# Patient Record
Sex: Female | Born: 1970 | Race: White | Hispanic: No | Marital: Married | Smoking: Current every day smoker
Health system: Southern US, Community
[De-identification: ages and names within clinical notes are randomized; demographics above are authoritative.]

## PROBLEM LIST (undated history)

## (undated) DIAGNOSIS — E785 Hyperlipidemia, unspecified: Secondary | ICD-10-CM

## (undated) DIAGNOSIS — F419 Anxiety disorder, unspecified: Secondary | ICD-10-CM

## (undated) DIAGNOSIS — D126 Benign neoplasm of colon, unspecified: Secondary | ICD-10-CM

## (undated) DIAGNOSIS — F329 Major depressive disorder, single episode, unspecified: Secondary | ICD-10-CM

## (undated) DIAGNOSIS — K760 Fatty (change of) liver, not elsewhere classified: Secondary | ICD-10-CM

## (undated) DIAGNOSIS — I10 Essential (primary) hypertension: Secondary | ICD-10-CM

## (undated) DIAGNOSIS — F32A Depression, unspecified: Secondary | ICD-10-CM

## (undated) DIAGNOSIS — E538 Deficiency of other specified B group vitamins: Secondary | ICD-10-CM

## (undated) HISTORY — DX: Hyperlipidemia, unspecified: E78.5

## (undated) HISTORY — DX: Benign neoplasm of colon, unspecified: D12.6

## (undated) HISTORY — DX: Fatty (change of) liver, not elsewhere classified: K76.0

## (undated) HISTORY — PX: TONSILLECTOMY: SUR1361

## (undated) HISTORY — DX: Depression, unspecified: F32.A

## (undated) HISTORY — DX: Anxiety disorder, unspecified: F41.9

## (undated) HISTORY — DX: Major depressive disorder, single episode, unspecified: F32.9

---

## 2000-10-29 ENCOUNTER — Ambulatory Visit (HOSPITAL_COMMUNITY): Admission: RE | Admit: 2000-10-29 | Discharge: 2000-10-29 | Payer: Self-pay | Admitting: Family Medicine

## 2000-10-29 ENCOUNTER — Encounter: Payer: Self-pay | Admitting: Family Medicine

## 2000-11-05 ENCOUNTER — Ambulatory Visit (HOSPITAL_COMMUNITY): Admission: RE | Admit: 2000-11-05 | Discharge: 2000-11-05 | Payer: Self-pay | Admitting: Family Medicine

## 2000-11-05 ENCOUNTER — Encounter: Payer: Self-pay | Admitting: Family Medicine

## 2004-08-19 ENCOUNTER — Emergency Department (HOSPITAL_COMMUNITY): Admission: EM | Admit: 2004-08-19 | Discharge: 2004-08-19 | Payer: Self-pay | Admitting: Emergency Medicine

## 2004-09-08 ENCOUNTER — Ambulatory Visit (HOSPITAL_COMMUNITY): Admission: RE | Admit: 2004-09-08 | Discharge: 2004-09-08 | Payer: Self-pay | Admitting: Family Medicine

## 2017-07-03 DIAGNOSIS — D126 Benign neoplasm of colon, unspecified: Secondary | ICD-10-CM

## 2017-07-03 HISTORY — DX: Benign neoplasm of colon, unspecified: D12.6

## 2017-07-27 ENCOUNTER — Encounter: Payer: Self-pay | Admitting: Family Medicine

## 2017-07-27 ENCOUNTER — Ambulatory Visit (INDEPENDENT_AMBULATORY_CARE_PROVIDER_SITE_OTHER): Payer: Managed Care, Other (non HMO) | Admitting: Family Medicine

## 2017-07-27 VITALS — BP 159/106 | HR 100 | Temp 97.3°F | Ht 64.0 in | Wt 197.0 lb

## 2017-07-27 DIAGNOSIS — E7849 Other hyperlipidemia: Secondary | ICD-10-CM | POA: Diagnosis not present

## 2017-07-27 DIAGNOSIS — F1010 Alcohol abuse, uncomplicated: Secondary | ICD-10-CM | POA: Diagnosis not present

## 2017-07-27 DIAGNOSIS — Z124 Encounter for screening for malignant neoplasm of cervix: Secondary | ICD-10-CM

## 2017-07-27 DIAGNOSIS — I10 Essential (primary) hypertension: Secondary | ICD-10-CM | POA: Diagnosis not present

## 2017-07-27 DIAGNOSIS — F17200 Nicotine dependence, unspecified, uncomplicated: Secondary | ICD-10-CM | POA: Insufficient documentation

## 2017-07-27 DIAGNOSIS — Z130 Encounter for screening for diseases of the blood and blood-forming organs and certain disorders involving the immune mechanism: Secondary | ICD-10-CM

## 2017-07-27 DIAGNOSIS — Z01419 Encounter for gynecological examination (general) (routine) without abnormal findings: Secondary | ICD-10-CM

## 2017-07-27 DIAGNOSIS — I152 Hypertension secondary to endocrine disorders: Secondary | ICD-10-CM | POA: Insufficient documentation

## 2017-07-27 DIAGNOSIS — K760 Fatty (change of) liver, not elsewhere classified: Secondary | ICD-10-CM | POA: Insufficient documentation

## 2017-07-27 DIAGNOSIS — E1169 Type 2 diabetes mellitus with other specified complication: Secondary | ICD-10-CM | POA: Insufficient documentation

## 2017-07-27 DIAGNOSIS — Z1329 Encounter for screening for other suspected endocrine disorder: Secondary | ICD-10-CM

## 2017-07-27 DIAGNOSIS — E663 Overweight: Secondary | ICD-10-CM | POA: Diagnosis not present

## 2017-07-27 DIAGNOSIS — Z7689 Persons encountering health services in other specified circumstances: Secondary | ICD-10-CM

## 2017-07-27 DIAGNOSIS — Z800 Family history of malignant neoplasm of digestive organs: Secondary | ICD-10-CM

## 2017-07-27 DIAGNOSIS — Z114 Encounter for screening for human immunodeficiency virus [HIV]: Secondary | ICD-10-CM

## 2017-07-27 LAB — BAYER DCA HB A1C WAIVED: HB A1C (BAYER DCA - WAIVED): 6.1 % (ref <7.0)

## 2017-07-27 MED ORDER — HYDROCHLOROTHIAZIDE 25 MG PO TABS: 25.0000 mg | ORAL_TABLET | Freq: Every day | ORAL | 0 refills | Status: DC

## 2017-07-27 NOTE — Assessment & Plan Note (Signed)
Counseling provided.  Patient does wish to discontinue smoking if possible.  She is failed nicotine patches and gum.  Amenable to trying Chantix.  Wellbutrin may not be a good option given her history of anxiety.  Will await lab results before initiating medications.

## 2017-07-27 NOTE — Assessment & Plan Note (Signed)
Patient reported.  Will check lipid panel.

## 2017-07-27 NOTE — Patient Instructions (Addendum)
I value your feedback and appreciate you entrusting [place] with your care.  If you get a survey, I would appreciate your taking the time to let us know what your experience was like.  You had labs performed today.  You will be contacted with the results of the labs once they are available, usually in the next 3 days for routine lab work.  Your Pap smear may take about a week to come back.  Please schedule your mammogram when you check out today.  I referred you to the GI specialist for your screening colonoscopy given your sibling who had colon cancer.  I have sent you on hydrochlorothiazide 25 mg to take every morning.  Monitor your blood pressures and bring in this record when you come in for your next visit.  Plan to see me back in 2 weeks for blood pressure, mood.   Health Maintenance, Female Adopting a healthy lifestyle and getting preventive care can go a long way to promote health and wellness. Talk with your health care provider about what schedule of regular examinations is right for you. This is a good chance for you to check in with your provider about disease prevention and staying healthy. In between checkups, there are plenty of things you can do on your own. Experts have done a lot of research about which lifestyle changes and preventive measures are most likely to keep you healthy. Ask your health care provider for more information. Weight and diet Eat a healthy diet  Be sure to include plenty of vegetables, fruits, low-fat dairy products, and lean protein.  Do not eat a lot of foods high in solid fats, added sugars, or salt.  Get regular exercise. This is one of the most important things you can do for your health. ? Most adults should exercise for at least 150 minutes each week. The exercise should increase your heart rate and make you sweat (moderate-intensity exercise). ? Most adults should also do strengthening exercises at least twice a week. This is in addition to the  moderate-intensity exercise.  Maintain a healthy weight  Body mass index (BMI) is a measurement that can be used to identify possible weight problems. It estimates body fat based on height and weight. Your health care provider can help determine your BMI and help you achieve or maintain a healthy weight.  For females 86 years of age and older: ? A BMI below 18.5 is considered underweight. ? A BMI of 18.5 to 24.9 is normal. ? A BMI of 25 to 29.9 is considered overweight. ? A BMI of 30 and above is considered obese.  Watch levels of cholesterol and blood lipids  You should start having your blood tested for lipids and cholesterol at 47 years of age, then have this test every 5 years.  You may need to have your cholesterol levels checked more often if: ? Your lipid or cholesterol levels are high. ? You are older than 47 years of age. ? You are at high risk for heart disease.  Cancer screening Lung Cancer  Lung cancer screening is recommended for adults 63-59 years old who are at high risk for lung cancer because of a history of smoking.  A yearly low-dose CT scan of the lungs is recommended for people who: ? Currently smoke. ? Have quit within the past 15 years. ? Have at least a 30-pack-year history of smoking. A pack year is smoking an average of one pack of cigarettes a day for 1  year.  Yearly screening should continue until it has been 15 years since you quit.  Yearly screening should stop if you develop a health problem that would prevent you from having lung cancer treatment.  Breast Cancer  Practice breast self-awareness. This means understanding how your breasts normally appear and feel.  It also means doing regular breast self-exams. Let your health care provider know about any changes, no matter how small.  If you are in your 20s or 30s, you should have a clinical breast exam (CBE) by a health care provider every 1-3 years as part of a regular health exam.  If you  are 73 or older, have a CBE every year. Also consider having a breast X-ray (mammogram) every year.  If you have a family history of breast cancer, talk to your health care provider about genetic screening.  If you are at high risk for breast cancer, talk to your health care provider about having an MRI and a mammogram every year.  Breast cancer gene (BRCA) assessment is recommended for women who have family members with BRCA-related cancers. BRCA-related cancers include: ? Breast. ? Ovarian. ? Tubal. ? Peritoneal cancers.  Results of the assessment will determine the need for genetic counseling and BRCA1 and BRCA2 testing.  Cervical Cancer Your health care provider may recommend that you be screened regularly for cancer of the pelvic organs (ovaries, uterus, and vagina). This screening involves a pelvic examination, including checking for microscopic changes to the surface of your cervix (Pap test). You may be encouraged to have this screening done every 3 years, beginning at age 51.  For women ages 57-65, health care providers may recommend pelvic exams and Pap testing every 3 years, or they may recommend the Pap and pelvic exam, combined with testing for human papilloma virus (HPV), every 5 years. Some types of HPV increase your risk of cervical cancer. Testing for HPV may also be done on women of any age with unclear Pap test results.  Other health care providers may not recommend any screening for nonpregnant women who are considered low risk for pelvic cancer and who do not have symptoms. Ask your health care provider if a screening pelvic exam is right for you.  If you have had past treatment for cervical cancer or a condition that could lead to cancer, you need Pap tests and screening for cancer for at least 20 years after your treatment. If Pap tests have been discontinued, your risk factors (such as having a new sexual partner) need to be reassessed to determine if screening should  resume. Some women have medical problems that increase the chance of getting cervical cancer. In these cases, your health care provider may recommend more frequent screening and Pap tests.  Colorectal Cancer  This type of cancer can be detected and often prevented.  Routine colorectal cancer screening usually begins at 47 years of age and continues through 47 years of age.  Your health care provider may recommend screening at an earlier age if you have risk factors for colon cancer.  Your health care provider may also recommend using home test kits to check for hidden blood in the stool.  A small camera at the end of a tube can be used to examine your colon directly (sigmoidoscopy or colonoscopy). This is done to check for the earliest forms of colorectal cancer.  Routine screening usually begins at age 23.  Direct examination of the colon should be repeated every 5-10 years through 47 years  of age. However, you may need to be screened more often if early forms of precancerous polyps or small growths are found.  Skin Cancer  Check your skin from head to toe regularly.  Tell your health care provider about any new moles or changes in moles, especially if there is a change in a mole's shape or color.  Also tell your health care provider if you have a mole that is larger than the size of a pencil eraser.  Always use sunscreen. Apply sunscreen liberally and repeatedly throughout the day.  Protect yourself by wearing long sleeves, pants, a wide-brimmed hat, and sunglasses whenever you are outside.  Heart disease, diabetes, and high blood pressure  High blood pressure causes heart disease and increases the risk of stroke. High blood pressure is more likely to develop in: ? People who have blood pressure in the high end of the normal range (130-139/85-89 mm Hg). ? People who are overweight or obese. ? People who are African American.  If you are 12-68 years of age, have your blood  pressure checked every 3-5 years. If you are 77 years of age or older, have your blood pressure checked every year. You should have your blood pressure measured twice-once when you are at a hospital or clinic, and once when you are not at a hospital or clinic. Record the average of the two measurements. To check your blood pressure when you are not at a hospital or clinic, you can use: ? An automated blood pressure machine at a pharmacy. ? A home blood pressure monitor.  If you are between 62 years and 31 years old, ask your health care provider if you should take aspirin to prevent strokes.  Have regular diabetes screenings. This involves taking a blood sample to check your fasting blood sugar level. ? If you are at a normal weight and have a low risk for diabetes, have this test once every three years after 47 years of age. ? If you are overweight and have a high risk for diabetes, consider being tested at a younger age or more often. Preventing infection Hepatitis B  If you have a higher risk for hepatitis B, you should be screened for this virus. You are considered at high risk for hepatitis B if: ? You were born in a country where hepatitis B is common. Ask your health care provider which countries are considered high risk. ? Your parents were born in a high-risk country, and you have not been immunized against hepatitis B (hepatitis B vaccine). ? You have HIV or AIDS. ? You use needles to inject street drugs. ? You live with someone who has hepatitis B. ? You have had sex with someone who has hepatitis B. ? You get hemodialysis treatment. ? You take certain medicines for conditions, including cancer, organ transplantation, and autoimmune conditions.  Hepatitis C  Blood testing is recommended for: ? Everyone born from [birth year] through 1965. ? Anyone with known risk factors for hepatitis C.  Sexually transmitted infections (STIs)  You should be screened for sexually transmitted  infections (STIs) including gonorrhea and chlamydia if: ? You are sexually active and are younger than 47 years of age. ? You are older than 47 years of age and your health care provider tells you that you are at risk for this type of infection. ? Your sexual activity has changed since you were last screened and you are at an increased risk for chlamydia or gonorrhea. Ask your health care  provider if you are at risk.  If you do not have HIV, but are at risk, it may be recommended that you take a prescription medicine daily to prevent HIV infection. This is called pre-exposure prophylaxis (PrEP). You are considered at risk if: ? You are sexually active and do not regularly use condoms or know the HIV status of your partner(s). ? You take drugs by injection. ? You are sexually active with a partner who has HIV.  Talk with your health care provider about whether you are at high risk of being infected with HIV. If you choose to begin PrEP, you should first be tested for HIV. You should then be tested every 3 months for as long as you are taking PrEP. Pregnancy  If you are premenopausal and you may become pregnant, ask your health care provider about preconception counseling.  If you may become pregnant, take 400 to 800 micrograms (mcg) of folic acid every day.  If you want to prevent pregnancy, talk to your health care provider about birth control (contraception). Osteoporosis and menopause  Osteoporosis is a disease in which the bones lose minerals and strength with aging. This can result in serious bone fractures. Your risk for osteoporosis can be identified using a bone density scan.  If you are 57 years of age or older, or if you are at risk for osteoporosis and fractures, ask your health care provider if you should be screened.  Ask your health care provider whether you should take a calcium or vitamin D supplement to lower your risk for osteoporosis.  Menopause may have certain physical  symptoms and risks.  Hormone replacement therapy may reduce some of these symptoms and risks. Talk to your health care provider about whether hormone replacement therapy is right for you. Follow these instructions at home:  Schedule regular health, dental, and eye exams.  Stay current with your immunizations.  Do not use any tobacco products including cigarettes, chewing tobacco, or electronic cigarettes.  If you are pregnant, do not drink alcohol.  If you are breastfeeding, limit how much and how often you drink alcohol.  Limit alcohol intake to no more than 1 drink per day for nonpregnant women. One drink equals 12 ounces of beer, 5 ounces of wine, or 1 ounces of hard liquor.  Do not use street drugs.  Do not share needles.  Ask your health care provider for help if you need support or information about quitting drugs.  Tell your health care provider if you often feel depressed.  Tell your health care provider if you have ever been abused or do not feel safe at home. This information is not intended to replace advice given to you by your health care provider. Make sure you discuss any questions you have with your health care provider. Document Released: 01/02/2011 Document Revised: 11/25/2015 Document Reviewed: 03/23/2015 Elsevier Interactive Patient Education  [employer].

## 2017-07-27 NOTE — Progress Notes (Signed)
ydr.   LITHZY Gutierrez is a 47 y.o. female presents to office today to establish care and for annual physical exam examination.    Concerns today include: She notes that she has not seen a doctor in over 10 years.  Has not had a Pap smear, mammogram, labs done in over 10 years.  Family history is significant for colon cancer in her sister at age 36 and testicular cancer in her brother.  Her past medical history is significant for hyperlipidemia.  She notes that she has measured high blood pressure several times in the last year.  However, she is never been treated for hypertension.  Denies chest pains, diaphoresis, lower extremity swelling, dizziness, visual disturbance.  She does note chronic shortness of breath with significant exertion.  She attributes this to deconditioning.  Marital status: married, Substance use: ETOH, tobacco Diet: poor, Exercise: none Last eye exam: >1 year Last dental exam: >1 year Last colonoscopy: never. Last mammogram: >5 years Last pap smear: >5 years Immunizations needed: Flu Vaccine: yes but declines  Past Medical History:  Diagnosis Date  . Anxiety   . Depression   . Fatty liver   . Hyperlipidemia    Social History   Socioeconomic History  . Marital status: Married    Spouse name: Not on file  . Number of children: Not on file  . Years of education: Not on file  . Highest education level: Not on file  Social Needs  . Financial resource strain: Not on file  . Food insecurity - worry: Not on file  . Food insecurity - inability: Not on file  . Transportation needs - medical: Not on file  . Transportation needs - non-medical: Not on file  Occupational History  . Not on file  Tobacco Use  . Smoking status: Current Every Day Smoker    Packs/day: 1.50    Years: 15.00    Pack years: 22.50  . Smokeless tobacco: Never Used  Substance and Sexual Activity  . Alcohol use: Yes    Alcohol/week: 16.8 oz    Types: 28 Cans of beer per week  . Drug use:  Not on file  . Sexual activity: Not on file  Other Topics Concern  . Not on file  Social History Narrative  . Not on file   Past Surgical History:  Procedure Laterality Date  . CESAREAN SECTION  2000   Family History  Problem Relation Age of Onset  . COPD Mother   . Hypertension Mother   . Cancer Mother   . Hyperlipidemia Mother   . Cervical cancer Mother   . Heart disease Mother   . Heart attack Mother   . Hypertension Father   . Cancer Sister   . Colon cancer Sister 28  . Testicular cancer Brother   . Anxiety disorder Daughter    No current outpatient medications on file.   ROS: Review of Systems Constitutional: negative Eyes: negative Ears, nose, mouth, throat, and face: negative Respiratory: positive for dyspnea on exertion Cardiovascular: negative Gastrointestinal: negative Genitourinary:positive for amenorrhea x4 months Integument/breast: negative Hematologic/lymphatic: negative Musculoskeletal:negative Neurological: negative Behavioral/Psych: positive for anxiety and depression Endocrine: negative Allergic/Immunologic: negative    Physical exam BP (!) 159/106   Pulse 100   Temp (!) 97.3 F (36.3 C) (Oral)   Ht '5\' 4"'  (1.626 m)   Wt 197 lb (89.4 kg)   BMI 33.81 kg/m  General appearance: alert, cooperative, appears stated age, no distress and moderately obese Head: Normocephalic, without  obvious abnormality, atraumatic Eyes: negative findings: lids and lashes normal, conjunctivae and sclerae normal, corneas clear and pupils equal, round, reactive to light and accomodation Ears: normal TM's and external ear canals both ears Nose: Nares normal. Septum midline. Mucosa normal. No drainage or sinus tenderness. Throat: lips, mucosa, and tongue normal; teeth and gums normal Neck: no adenopathy, supple, symmetrical, trachea midline and thyroid not enlarged, symmetric, no tenderness/mass/nodules Back: symmetric, no curvature. ROM normal. No CVA  tenderness. Lungs: clear to auscultation bilaterally Breasts: normal appearance, no masses or tenderness, pendulous Heart: regular rate and rhythm, S1, S2 normal, no murmur, click, rub or gallop Abdomen: soft, non-tender; bowel sounds normal; no masses,  no organomegaly Pelvic: cervix normal in appearance, external genitalia normal, no adnexal masses or tenderness, no cervical motion tenderness, rectovaginal septum normal, uterus normal size, shape, and consistency and vagina normal without discharge Extremities: extremities normal, atraumatic, no cyanosis or edema Pulses: 2+ and symmetric Skin: Skin color, texture, turgor normal. No rashes or lesions; left great toe nail with onychomycosis. Lymph nodes: Cervical, supraclavicular, and axillary nodes normal. Neurologic: Grossly normal  PSych: Mood stable, speech normal, affect appropriate, good eye contact  Depression screen [facility] 2/9 07/27/2017  Decreased Interest 2  Down, Depressed, Hopeless 3  PHQ - 2 Score 5  Altered sleeping 2  Tired, decreased energy 2  Change in appetite 2  Feeling bad or failure about yourself  2  Trouble concentrating 1  Moving slowly or fidgety/restless 1  PHQ-9 Score 15    Assessment/ Plan: Bridget Gutierrez here for annual physical exam.   Essential hypertension Newly diagnosed.  Will initiate hydrochlorthiazide 25 mg daily.  Patient to monitor blood pressures at least 3 times per week.  She will follow-up in 2 weeks for blood pressure recheck.  Check CMP, TSH and lipid.  Excessive drinking alcohol We reviewed that more than 1 beer per day is considered excessive alcohol consumption.  We will gradually wean off of alcohol.  Familial hyperlipidemia Patient reported.  Will check lipid panel.  Tobacco use disorder Counseling provided.  Patient does wish to discontinue smoking if possible.  She is failed nicotine patches and gum.  Amenable to trying Chantix.  Wellbutrin may not be a good option given her history  of anxiety.  Will await lab results before initiating medications.  Well woman exam with routine gynecological exam Patient is schedule mammogram at checkout. - Pap IG, CT/NG NAA, and HPV (high risk) [facility]  Encounter to establish care with new doctor Will need a tetanus shot at next visit.  Screening for malignant neoplasm of cervix - Pap IG, CT/NG NAA, and HPV (high risk) [facility]  Screening for deficiency anemia - CBC with Differential  Screening for thyroid disorder - TSH  Screening for HIV without presence of risk factors - HIV antibody  Overweight Counseled on healthy lifestyle choices, including diet (rich in fruits, vegetables and lean meats and low in salt and simple carbohydrates) and exercise (at least 30 minutes of moderate physical activity daily). - Bayer DCA Hb A1c Waived  Family history of malignant neoplasm of colon in relative diagnosed when younger than 47 years of age Will need screening colonoscopy given first-degree relative with colon cancer at age 5. - Ambulatory referral to Gastroenterology  Fatty liver disease, nonalcoholic Check Lipid panel.  Orders Placed This Encounter  Procedures  . CMP14+EGFR  . Lipid Panel  . TSH  . CBC with Differential  . HIV antibody  . Bayer DCA Hb  A1c Waived  . Ambulatory referral to Gastroenterology    Referral Priority:   Routine    Referral Type:   Consultation    Referral Reason:   Specialty Services Required    Number of Visits Requested:   1   Meds ordered this encounter  Medications  . hydrochlorothiazide (HYDRODIURIL) 25 MG tablet    Sig: Take 1 tablet (25 mg total) by mouth daily.    Dispense:  30 tablet    Refill:  0   Patient to follow up in 2 weeks for BP/ mood.  Sheronica Corey M. Lajuana Ripple, DO

## 2017-07-27 NOTE — Assessment & Plan Note (Signed)
We reviewed that more than 1 beer per day is considered excessive alcohol consumption.  We will gradually wean off of alcohol.

## 2017-07-27 NOTE — Assessment & Plan Note (Signed)
Newly diagnosed.  Will initiate hydrochlorthiazide 25 mg daily.  Patient to monitor blood pressures at least 3 times per week.  She will follow-up in 2 weeks for blood pressure recheck.  Check CMP, TSH and lipid.

## 2017-07-28 LAB — LIPID PANEL
Chol/HDL Ratio: 15.7 ratio — ABNORMAL HIGH (ref 0.0–4.4)
Cholesterol, Total: 456 mg/dL — ABNORMAL HIGH (ref 100–199)
HDL: 29 mg/dL — ABNORMAL LOW (ref >39)
Triglycerides: 458 mg/dL — ABNORMAL HIGH (ref 0–149)

## 2017-07-28 LAB — CBC WITH DIFFERENTIAL/PLATELET
BASOS: 1 %
Basophils Absolute: 0.1 10*3/uL (ref 0.0–0.2)
EOS (ABSOLUTE): 0.2 10*3/uL (ref 0.0–0.4)
EOS: 1 %
HEMATOCRIT: 46 % (ref 34.0–46.6)
HEMOGLOBIN: 16 g/dL — AB (ref 11.1–15.9)
Immature Grans (Abs): 0 10*3/uL (ref 0.0–0.1)
Immature Granulocytes: 0 %
LYMPHS ABS: 4 10*3/uL — AB (ref 0.7–3.1)
Lymphs: 29 %
MCH: 33.7 pg — AB (ref 26.6–33.0)
MCHC: 34.8 g/dL (ref 31.5–35.7)
MCV: 97 fL (ref 79–97)
MONOCYTES: 6 %
Monocytes Absolute: 0.9 10*3/uL (ref 0.1–0.9)
NEUTROS ABS: 8.6 10*3/uL — AB (ref 1.4–7.0)
Neutrophils: 63 %
Platelets: 381 10*3/uL — ABNORMAL HIGH (ref 150–379)
RBC: 4.75 x10E6/uL (ref 3.77–5.28)
RDW: 14.7 % (ref 12.3–15.4)
WBC: 13.8 10*3/uL — ABNORMAL HIGH (ref 3.4–10.8)

## 2017-07-28 LAB — HIV ANTIBODY (ROUTINE TESTING W REFLEX): HIV Screen 4th Generation wRfx: NONREACTIVE

## 2017-07-28 LAB — TSH: TSH: 1.23 u[IU]/mL (ref 0.450–4.500)

## 2017-07-28 LAB — CMP14+EGFR
ALT: 52 IU/L — ABNORMAL HIGH (ref 0–32)
AST: 41 IU/L — ABNORMAL HIGH (ref 0–40)
Albumin/Globulin Ratio: 1.4 (ref 1.2–2.2)
Albumin: 4.5 g/dL (ref 3.5–5.5)
Alkaline Phosphatase: 125 IU/L — ABNORMAL HIGH (ref 39–117)
BUN/Creatinine Ratio: 16 (ref 9–23)
BUN: 10 mg/dL (ref 6–24)
Bilirubin Total: 0.2 mg/dL (ref 0.0–1.2)
CO2: 15 mmol/L — ABNORMAL LOW (ref 20–29)
Calcium: 9.8 mg/dL (ref 8.7–10.2)
Chloride: 102 mmol/L (ref 96–106)
Creatinine, Ser: 0.62 mg/dL (ref 0.57–1.00)
GFR calc Af Amer: 125 mL/min/{1.73_m2} (ref >59)
GFR calc non Af Amer: 108 mL/min/{1.73_m2} (ref >59)
Globulin, Total: 3.2 g/dL (ref 1.5–4.5)
Glucose: 110 mg/dL — ABNORMAL HIGH (ref 65–99)
Potassium: 4.5 mmol/L (ref 3.5–5.2)
Sodium: 141 mmol/L (ref 134–144)
Total Protein: 7.7 g/dL (ref 6.0–8.5)

## 2017-07-30 ENCOUNTER — Other Ambulatory Visit: Payer: Self-pay | Admitting: Family Medicine

## 2017-07-30 DIAGNOSIS — R7989 Other specified abnormal findings of blood chemistry: Secondary | ICD-10-CM

## 2017-07-30 DIAGNOSIS — E7889 Other lipoprotein metabolism disorders: Secondary | ICD-10-CM

## 2017-07-30 DIAGNOSIS — R748 Abnormal levels of other serum enzymes: Secondary | ICD-10-CM

## 2017-07-30 DIAGNOSIS — R945 Abnormal results of liver function studies: Principal | ICD-10-CM

## 2017-07-31 LAB — PAP IG, CT-NG NAA, HPV HIGH-RISK
Chlamydia, Nuc. Acid Amp: NEGATIVE
Gonococcus by Nucleic Acid Amp: NEGATIVE
HPV, high-risk: NEGATIVE
PAP Smear Comment: 0
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
## 2017-08-13 ENCOUNTER — Encounter: Payer: Self-pay | Admitting: Family Medicine

## 2017-08-13 ENCOUNTER — Ambulatory Visit: Payer: Managed Care, Other (non HMO) | Admitting: Family Medicine

## 2017-08-13 VITALS — BP 157/96 | HR 88 | Temp 97.6°F | Ht 64.0 in | Wt 192.0 lb

## 2017-08-13 DIAGNOSIS — F419 Anxiety disorder, unspecified: Secondary | ICD-10-CM | POA: Diagnosis not present

## 2017-08-13 DIAGNOSIS — F329 Major depressive disorder, single episode, unspecified: Secondary | ICD-10-CM | POA: Diagnosis not present

## 2017-08-13 DIAGNOSIS — I10 Essential (primary) hypertension: Secondary | ICD-10-CM | POA: Diagnosis not present

## 2017-08-13 DIAGNOSIS — F32A Depression, unspecified: Secondary | ICD-10-CM | POA: Insufficient documentation

## 2017-08-13 MED ORDER — HYDROCHLOROTHIAZIDE 25 MG PO TABS: 25.0000 mg | ORAL_TABLET | Freq: Every day | ORAL | 5 refills | Status: DC

## 2017-08-13 MED ORDER — AMLODIPINE BESYLATE 5 MG PO TABS
5.0000 mg | ORAL_TABLET | Freq: Every day | ORAL | 0 refills | Status: DC
Start: 2017-08-13 — End: 2017-09-07

## 2017-08-13 MED ORDER — ESCITALOPRAM OXALATE 10 MG PO TABS: 10.0000 mg | ORAL_TABLET | Freq: Every day | ORAL | 0 refills | Status: DC

## 2017-08-13 NOTE — Assessment & Plan Note (Signed)
Gad 7 score 20.  PHQ 9 score 18.  Given her liver impairment, will initiate Lexapro at max dose of 10 mg daily.  I did recommend that she start this tomorrow.  Handout provided with local resources for counseling and psychiatry.  Patient has a appointment with hematology next week.  We will await their assessment before increasing Lexapro dose further.  Patient to follow-up in the next 3 weeks for recheck.

## 2017-08-13 NOTE — Patient Instructions (Signed)
Continue to monitor blood pressures daily.  I have prescribed you amlodipine to take in addition to your hydrochlorothiazide daily.  As we discussed, start this medication today.  You may start the Lexapro tomorrow.  Follow-up in the next 3 weeks for check in on blood pressure and anxiety/depression.  I have provided you a list of therapist and psychiatrist in the area should you decide that you would also like to see 1 of them.  Taking the medicine as directed and not missing any doses is one of the best things you can do to treat your depression.  Here are some things to keep in mind:  1) Side effects (stomach upset, some increased anxiety) may happen before you notice a benefit.  These side effects typically go away over time. 2) Changes to your dose of medicine or a change in medication all together is sometimes necessary 3) Most people need to be on medication at least 12 months 4) Many people will notice an improvement within two weeks but the full effect of the medication can take up to 4-6 weeks 5) Stopping the medication when you start feeling better often results in a return of symptoms 6) Never discontinue your medication without contacting a health care professional first.  Some medications require gradual discontinuation/ taper and can make you sick if you stop them abruptly.  If your symptoms worsen or you have thoughts of suicide/homicide, PLEASE SEEK IMMEDIATE MEDICAL ATTENTION.  You may always call:  National Suicide Hotline: 9598673733 [place]: [phone] Crisis Recovery in [place]: [phone]   These are available 24 hours a day, 7 days a week.    Your provider wants you to schedule an appointment with a Psychologist/Psychiatrist. The following list of offices requires the patient to call and make their own appointment, as there is information they need that only you can provide. Please feel free to choose form the following providers:  [place] in [place]  [place]  [phone] [place], [place]  (Scheduled through [place]) Must call and do an interview for appointment. Sees Children / Accepts Medicaid  [facility] in [place]  [address] [place]    [place], [place]  [phone] [place], [place] for Autism but does not treat it Sees Children / Accepts Medicaid  [facility]    [phone] [address] [place], [place] Medication management, substance abuse, bipolar, grief, family, marriage, OCD, anxiety, PTSD Sees children / Accepts Medicaid  [facility]    [phone] [address], [place], [place] children / Accepts [facility]  [facility]  [phone] [address] [place], [place]   Dr Lorenza Evangelist     [phone] [address], [place], [place]  Sees ADD & ADHD for treatment Accepts Medicaid  [facility]  [phone] [address] Arlean Hopping, [place] for Autism Accepts [facility]  [facility]  [phone] [place], [place]  Does Adult ADD evaluations Does not accept Medicaid  [facility]   [phone] [place], [place] therapy  Sees children as young as 62 years old Accepts Medicaid  [facility]     [phone]    [address]  [place], [place] [address] Sees children Accepts Medicaid

## 2017-08-13 NOTE — Assessment & Plan Note (Signed)
Blood pressure is not at goal.  She is asymptomatic.  Already on max dose of hydrochlorothiazide.  Add Norvasc 5 mg.  Will also add SSRI for anxiety and depression see below.  Patient to follow-up in the next 2-3 weeks for blood pressure recheck.  Continue monitoring blood pressures daily and record.  Continue lifestyle modifications.

## 2017-08-14 ENCOUNTER — Telehealth: Payer: Self-pay

## 2017-08-14 DIAGNOSIS — F32A Depression, unspecified: Secondary | ICD-10-CM

## 2017-08-14 DIAGNOSIS — F329 Major depressive disorder, single episode, unspecified: Secondary | ICD-10-CM

## 2017-08-14 DIAGNOSIS — F419 Anxiety disorder, unspecified: Principal | ICD-10-CM
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
## 2017-08-15 ENCOUNTER — Encounter (HOSPITAL_COMMUNITY): Payer: Self-pay | Admitting: Psychiatry

## 2017-08-15 NOTE — Telephone Encounter (Signed)
This encounter was created in error - please disregard.  This encounter was created in error - please disregard.
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
## 2017-08-17 ENCOUNTER — Encounter (HOSPITAL_COMMUNITY): Payer: Self-pay | Admitting: Psychiatry

## 2017-08-17 NOTE — Telephone Encounter (Signed)
This encounter was created in error - please disregard.

## 2017-08-20 ENCOUNTER — Ambulatory Visit: Payer: Self-pay | Admitting: Gastroenterology

## 2017-08-20 ENCOUNTER — Ambulatory Visit: Payer: Managed Care, Other (non HMO) | Admitting: Gastroenterology

## 2017-08-20 ENCOUNTER — Encounter: Payer: Self-pay | Admitting: Gastroenterology

## 2017-08-20 VITALS — BP 162/109 | HR 81 | Ht 64.0 in | Wt 189.4 lb

## 2017-08-20 DIAGNOSIS — Z1211 Encounter for screening for malignant neoplasm of colon: Secondary | ICD-10-CM

## 2017-08-20 DIAGNOSIS — R7989 Other specified abnormal findings of blood chemistry: Secondary | ICD-10-CM

## 2017-08-20 DIAGNOSIS — Z114 Encounter for screening for human immunodeficiency virus [HIV]: Secondary | ICD-10-CM | POA: Diagnosis not present

## 2017-08-20 DIAGNOSIS — R945 Abnormal results of liver function studies: Secondary | ICD-10-CM | POA: Diagnosis not present
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
## 2017-08-21 NOTE — Addendum Note (Signed)
Addended by: Peggye Ley on: 08/21/2017 10:19 AM   Modules accepted: Orders, SmartSet

## 2017-08-22 ENCOUNTER — Telehealth (HOSPITAL_COMMUNITY): Payer: Self-pay | Admitting: Psychiatry
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
## 2017-08-28 ENCOUNTER — Telehealth (HOSPITAL_COMMUNITY): Payer: Self-pay

## 2017-08-28 ENCOUNTER — Ambulatory Visit (HOSPITAL_COMMUNITY)
Admission: RE | Admit: 2017-08-28 | Discharge: 2017-08-28 | Disposition: A | Discharge status: Home / Self Care | Payer: Managed Care, Other (non HMO) | Source: Ambulatory Visit | Attending: Gastroenterology | Admitting: Gastroenterology

## 2017-08-28 ENCOUNTER — Other Ambulatory Visit (HOSPITAL_COMMUNITY)
Admission: RE | Admit: 2017-08-28 | Discharge: 2017-08-28 | Disposition: A | Discharge status: Home / Self Care | Payer: Managed Care, Other (non HMO) | Source: Ambulatory Visit | Attending: Gastroenterology | Admitting: Gastroenterology

## 2017-08-28 DIAGNOSIS — R7989 Other specified abnormal findings of blood chemistry: Secondary | ICD-10-CM

## 2017-08-28 DIAGNOSIS — R945 Abnormal results of liver function studies: Secondary | ICD-10-CM | POA: Insufficient documentation

## 2017-08-28 LAB — CBC WITH DIFFERENTIAL/PLATELET
BASOS ABS: 0.1 10*3/uL (ref 0.0–0.1)
BASOS PCT: 1 %
EOS PCT: 1 %
Eosinophils Absolute: 0.1 10*3/uL (ref 0.0–0.7)
HEMATOCRIT: 45.8 % (ref 36.0–46.0)
Hemoglobin: 15.8 g/dL — ABNORMAL HIGH (ref 12.0–15.0)
LYMPHS PCT: 36 %
Lymphs Abs: 4 10*3/uL (ref 0.7–4.0)
MCH: 33.2 pg (ref 26.0–34.0)
MCHC: 34.5 g/dL (ref 30.0–36.0)
MCV: 96.2 fL (ref 78.0–100.0)
MONO ABS: 0.8 10*3/uL (ref 0.1–1.0)
Monocytes Relative: 7 %
NEUTROS ABS: 6.1 10*3/uL (ref 1.7–7.7)
Neutrophils Relative %: 55 %
PLATELETS: 422 10*3/uL — AB (ref 150–400)
RBC: 4.76 MIL/uL (ref 3.87–5.11)
RDW: 13.1 % (ref 11.5–15.5)
WBC: 11 10*3/uL — AB (ref 4.0–10.5)

## 2017-08-28 LAB — IRON AND TIBC
Iron: 108 ug/dL (ref 28–170)
SATURATION RATIOS: 28 % (ref 10.4–31.8)
TIBC: 382 ug/dL (ref 250–450)
UIBC: 274 ug/dL

## 2017-08-28 LAB — COMPREHENSIVE METABOLIC PANEL
ALBUMIN: 4.4 g/dL (ref 3.5–5.0)
ALT: 59 U/L — AB (ref 14–54)
AST: 35 U/L (ref 15–41)
Alkaline Phosphatase: 97 U/L (ref 38–126)
Anion gap: 13 (ref 5–15)
BUN: 12 mg/dL (ref 6–20)
CHLORIDE: 99 mmol/L — AB (ref 101–111)
CO2: 25 mmol/L (ref 22–32)
CREATININE: 0.7 mg/dL (ref 0.44–1.00)
Calcium: 9.9 mg/dL (ref 8.9–10.3)
GFR calc Af Amer: >60 mL/min (ref >60)
GFR calc non Af Amer: >60 mL/min (ref >60)
GLUCOSE: 102 mg/dL — AB (ref 65–99)
POTASSIUM: 3.2 mmol/L — AB (ref 3.5–5.1)
Sodium: 137 mmol/L (ref 135–145)
Total Bilirubin: 0.5 mg/dL (ref 0.3–1.2)
Total Protein: 7.9 g/dL (ref 6.5–8.1)

## 2017-08-28 LAB — CK: CK TOTAL: 75 U/L (ref 38–234)

## 2017-08-28 LAB — FERRITIN: Ferritin: 124 ng/mL (ref 11–307)

## 2017-08-28 NOTE — Telephone Encounter (Signed)
[facility] - left message.  

## 2017-08-29 LAB — PTH, INTACT AND CALCIUM
CALCIUM TOTAL (PTH): 9.9 mg/dL (ref 8.7–10.2)
PTH: 27 pg/mL (ref 15–65)

## 2017-08-29 LAB — CERULOPLASMIN: Ceruloplasmin: 33.1 mg/dL (ref 19.0–39.0)

## 2017-08-29 LAB — HIV ANTIBODY (ROUTINE TESTING W REFLEX): HIV SCREEN 4TH GENERATION: NONREACTIVE

## 2017-08-29 LAB — HEPATITIS B SURFACE ANTIBODY, QUANTITATIVE: HEPATITIS B-POST: 216.8 m[IU]/mL

## 2017-08-29 LAB — HEPATITIS C ANTIBODY: HCV Ab: 0.1 s/co ratio (ref 0.0–0.9)

## 2017-08-29 LAB — HEPATITIS A ANTIBODY, TOTAL: HEP A TOTAL AB: NEGATIVE

## 2017-08-29 LAB — ANTI-MICROSOMAL ANTIBODY LIVER / KIDNEY: LKM1 Ab: 1 Units (ref 0.0–20.0)

## 2017-08-29 LAB — HEPATITIS B CORE ANTIBODY, TOTAL: Hep B Core Total Ab: NEGATIVE

## 2017-08-30 LAB — IMMUNOGLOBULINS A/E/G/M, SERUM
IGE (IMMUNOGLOBULIN E), SERUM: 283 [IU]/mL — AB (ref 0–100)
IGG (IMMUNOGLOBIN G), SERUM: 1063 mg/dL (ref 700–1600)
IgA: 248 mg/dL (ref 87–352)
IgM (Immunoglobulin M), Srm: 166 mg/dL (ref 26–217)

## 2017-08-30 LAB — ALPHA-1 ANTITRYPSIN PHENOTYPE: A-1 Antitrypsin, Ser: 146 mg/dL (ref 90–200)

## 2017-08-30 LAB — ANTINUCLEAR ANTIBODIES, IFA: ANA Ab, IFA: NEGATIVE

## 2017-09-02 ENCOUNTER — Encounter: Payer: Self-pay | Admitting: Gastroenterology

## 2017-09-02 NOTE — Progress Notes (Signed)
Subjective: CC: f/u BP and GAD/Depression HPI: Bridget Gutierrez is a 47 y.o. female presenting to clinic today for:  1. Hypertension Patient reports Blood pressure at home: 125-134/82-91; Meds: Compliant with Norvasc 5mg  and HCTZ 12.5mg  Side effects: none. She continues to work on diet.  Denies headache, dizziness, visual changes, nausea, vomiting, chest pain, LE swelling, abdominal pain or shortness of breath.  She is still a smoker.  2. GAD/Depression She reports that mood swings have greatly improved on Lexapro 10 mg.  She is tolerating medication without difficulty.  Denies nausea, vomiting, diarrhea, abdominal pain.  3.  Foot numbness and tingling Patient reports numbness and tingling in bilateral feet, particularly in the toes that is been ongoing for about 8 months.  She notes that she has a distant history about 15 years ago of vitamin B12 deficiency.  She was also a moderate alcohol drinker for several years.  She is no longer consuming alcohol.  She has not been treated for vitamin B12 deficiency in years.  Previously she was on oral vitamin B12 tablets.  She is actually seen neurology in the past for this and had nerve conduction studies which were unrevealing.  Denies personal history of diabetes.  4. Hypokalemia Found incidentally on recent labs performed by GI.  Denies lower extremity cramping, heart palpitations or dizziness.  She is on HCTZ.  ROS: Per HPI  Past Medical History:  Diagnosis Date  . Anxiety   . Depression   . Fatty liver   . Hyperlipidemia    No Known Allergies  Current Outpatient Medications:  .  amLODipine (NORVASC) 5 MG tablet, Take 1 tablet (5 mg total) by mouth daily., Disp: 30 tablet, Rfl: 0 .  escitalopram (LEXAPRO) 10 MG tablet, Take 1 tablet (10 mg total) by mouth daily., Disp: 30 tablet, Rfl: 0 .  hydrochlorothiazide (HYDRODIURIL) 25 MG tablet, Take 1 tablet (25 mg total) by mouth daily., Disp: 30 tablet, Rfl: 5 Social History   Socioeconomic  History  . Marital status: Married    Spouse name: Not on file  . Number of children: Not on file  . Years of education: Not on file  . Highest education level: Not on file  Social Needs  . Financial resource strain: Not on file  . Food insecurity - worry: Not on file  . Food insecurity - inability: Not on file  . Transportation needs - medical: Not on file  . Transportation needs - non-medical: Not on file  Occupational History  . Not on file  Tobacco Use  . Smoking status: Current Every Day Smoker    Packs/day: 1.50    Years: 15.00    Pack years: 22.50  . Smokeless tobacco: Never Used  Substance and Sexual Activity  . Alcohol use: No    Frequency: Never    Comment: Former 28 cans beer/ week  . Drug use: No  . Sexual activity: Not Currently  Other Topics Concern  . Not on file  Social History Narrative  . Not on file   Family History  Problem Relation Age of Onset  . COPD Mother   . Hypertension Mother   . Cancer Mother   . Hyperlipidemia Mother   . Cervical cancer Mother   . Heart disease Mother   . Heart attack Mother   . Hypertension Father   . Cancer Sister   . Colon cancer Sister 104  . Testicular cancer Brother   . Anxiety disorder Daughter     Health  Maintenance: Mammo and colonoscopy scheduled.  Objective: Office vital signs reviewed. BP 122/77   Pulse 71   Temp (!) 97.3 F (36.3 C) (Oral)   Ht 5\' 4"  (1.626 m)   Wt 187 lb (84.8 kg)   BMI 32.10 kg/m   Physical Examination:  General: Awake, alert, well nourished, No acute distress HEENT: MMM, sclera white Cardio: regular rate and rhythm, S1S2 heard, no murmurs appreciated Pulm: clear to auscultation bilaterally, no wheezes, rhonchi or rales; normal work of breathing on room air Extremities: warm, well perfused, No edema, cyanosis or clubbing; +2 pulses bilaterally MSK: normal gait and normal station Skin: dry; intact; no rashes or lesions Neuro: Light touch sensation grossly intact, vibratory  sense intact to the lower extremities.  Temperature sensation intact.  Monofilament testing within normal limits.  Depression screen [facility] 2/9 09/03/2017 08/14/2017 08/13/2017  Decreased Interest 1 3 2   Down, Depressed, Hopeless 1 2 2   PHQ - 2 Score 2 5 4   Altered sleeping 1 3 3   Tired, decreased energy 1 3 2   Change in appetite 1 1 3   Feeling bad or failure about yourself  0 3 3  Trouble concentrating 1 0 1  Moving slowly or fidgety/restless 0 0 1  Suicidal thoughts 0 - 1  PHQ-9 Score 6 15 18   Difficult doing work/chores Somewhat difficult - Somewhat difficult   GAD 7 : Generalized Anxiety Score 09/03/2017 08/14/2017 08/13/2017  Nervous, Anxious, on Edge 1 3 3   Control/stop worrying 1 3 3   Worry too much - different things 1 2 3   Trouble relaxing 0 3 3  Restless 0 3 2  Easily annoyed or irritable 1 1 3   Afraid - awful might happen 1 0 3  Total GAD 7 Score 5 15 20   Anxiety Difficulty Somewhat difficult - Very difficult    Assessment/ Plan: 46 y.o. female   Essential hypertension Blood pressure at goal on Norvasc 5 mg and hydrochlorothiazide 12.5 mg.  Check potassium, as she was hypokalemic at the end of February.  Tolerating medication without difficulty.  Continue to monitor blood pressures 2-3 times weekly.  She will follow-up in 3- 6 months for this.  Numbness and tingling of both feet Her exam was fairly unremarkable today.  She had intact vibratory sense, light touch sensation, temperature, and pressure.  Per her reports she has had a history of vitamin B12 deficiency in the past.  We will check into this today.  Also check A1c.  History of alcoholism which may also play a part.  Will contact patient once results are available.  Anxiety and depression Well-controlled on Lexapro 10 mg daily.  Both PHQ 9 and gad 7 scores improved today.  We will continue to monitor every 3-6 months.  Hypokalemia Check potassium - Basic Metabolic Panel   Janora Norlander, DO [place] [phone]

## 2017-09-03 ENCOUNTER — Telehealth: Payer: Self-pay

## 2017-09-03 ENCOUNTER — Encounter: Payer: Self-pay | Admitting: Family Medicine

## 2017-09-03 ENCOUNTER — Ambulatory Visit: Payer: Managed Care, Other (non HMO) | Admitting: Family Medicine

## 2017-09-03 VITALS — BP 122/77 | HR 71 | Temp 97.3°F | Ht 64.0 in | Wt 187.0 lb

## 2017-09-03 DIAGNOSIS — I10 Essential (primary) hypertension: Secondary | ICD-10-CM

## 2017-09-03 DIAGNOSIS — F329 Major depressive disorder, single episode, unspecified: Secondary | ICD-10-CM | POA: Diagnosis not present

## 2017-09-03 DIAGNOSIS — F419 Anxiety disorder, unspecified: Secondary | ICD-10-CM | POA: Diagnosis not present

## 2017-09-03 DIAGNOSIS — R202 Paresthesia of skin: Secondary | ICD-10-CM | POA: Diagnosis not present

## 2017-09-03 DIAGNOSIS — E876 Hypokalemia: Secondary | ICD-10-CM

## 2017-09-03 DIAGNOSIS — R200 Anesthesia of skin: Secondary | ICD-10-CM

## 2017-09-03 NOTE — Patient Instructions (Signed)
You had labs performed today.  You will be contacted with the results of the labs once they are available, usually in the next 3 days for routine lab work.  Ask the GI specialist about starting cholesterol medications.  See me back in 6 months or sooner if needed.   Vitamin B12 Deficiency Vitamin B12 deficiency means that your body is not getting enough vitamin B12. Your body needs vitamin B12 for important bodily functions. If you do not have enough vitamin B12 in your body, you can have health problems. Follow these instructions at home:  Take supplements only as told by your doctor. Follow the directions carefully.  Get any shots (injections) as told by your doctor. Do not miss your visits to the doctor.  Eat lots of healthy foods that contain vitamin B12. Ask your doctor if you should work with someone who is trained in how food affects health (dietitian). Foods that contain vitamin B12 include: ? Meat. ? Meat from birds (poultry). ? Fish. ? Eggs. ? Cereal and dairy products that are fortified. This means that vitamin B12 has been added to the food. Check the label on the package to see if the food is fortified.  Do not drink too much (do not abuse) alcohol.  Keep all follow-up visits as told by your doctor. This is important. Contact a doctor if:  Your symptoms come back. Get help right away if:  You have trouble breathing.  You have chest pain.  You get dizzy.  You pass out (lose consciousness). This information is not intended to replace advice given to you by your health care provider. Make sure you discuss any questions you have with your health care provider. Document Released: 06/08/2011 Document Revised: 11/25/2015 Document Reviewed: 11/04/2014 Elsevier Interactive Patient Education  [employer].

## 2017-09-03 NOTE — Assessment & Plan Note (Signed)
Well-controlled on Lexapro 10 mg daily.  Both PHQ 9 and gad 7 scores improved today.  We will continue to monitor every 3-6 months.

## 2017-09-03 NOTE — Assessment & Plan Note (Addendum)
Her exam was fairly unremarkable today.  She had intact vibratory sense, light touch sensation, temperature, and pressure.  Per her reports she has had a history of vitamin B12 deficiency in the past.  We will check into this today.  Also check A1c.  History of alcoholism which may also play a part.  Will contact patient once results are available.

## 2017-09-03 NOTE — Assessment & Plan Note (Signed)
Blood pressure at goal on Norvasc 5 mg and hydrochlorothiazide 12.5 mg.  Check potassium, as she was hypokalemic at the end of February.  Tolerating medication without difficulty.  Continue to monitor blood pressures 2-3 times weekly.  She will follow-up in 3- 6 months for this.

## 2017-09-03 NOTE — Telephone Encounter (Signed)
LVM for return call for results per Dr. Vicente Males.    - Inform all result so far normal except IGE elevated which can be so in allergies

## 2017-09-04 ENCOUNTER — Telehealth: Payer: Self-pay

## 2017-09-04 DIAGNOSIS — F329 Major depressive disorder, single episode, unspecified: Secondary | ICD-10-CM

## 2017-09-04 DIAGNOSIS — F419 Anxiety disorder, unspecified: Principal | ICD-10-CM

## 2017-09-04 LAB — BASIC METABOLIC PANEL
BUN / CREAT RATIO: 11 (ref 9–23)
BUN: 7 mg/dL (ref 6–24)
CO2: 23 mmol/L (ref 20–29)
CREATININE: 0.63 mg/dL (ref 0.57–1.00)
Calcium: 10.1 mg/dL (ref 8.7–10.2)
Chloride: 99 mmol/L (ref 96–106)
GFR calc Af Amer: 124 mL/min/{1.73_m2} (ref >59)
GFR calc non Af Amer: 108 mL/min/{1.73_m2} (ref >59)
GLUCOSE: 91 mg/dL (ref 65–99)
Potassium: 3.5 mmol/L (ref 3.5–5.2)
SODIUM: 140 mmol/L (ref 134–144)

## 2017-09-04 LAB — VITAMIN B12: Vitamin B-12: 316 pg/mL (ref 232–1245)

## 2017-09-04 LAB — HEMOGLOBIN A1C
ESTIMATED AVERAGE GLUCOSE: 126 mg/dL
HEMOGLOBIN A1C: 6 % — AB (ref 4.8–5.6)

## 2017-09-04 NOTE — Progress Notes (Signed)
[place] Telephone Follow-up  MRN: [id number] NAME: Bridget Gutierrez Date: 09/04/17 Time of Assessment: 2:46 PM Call number: 2/6  Reason for call today: Reason for Contact: PHQ9-2 weeks  PHQ-9 Scores:  Depression screen [facility] 2/9 09/04/2017 09/03/2017 08/14/2017 08/13/2017 07/27/2017  Decreased Interest 1 1 3 2 2   Down, Depressed, Hopeless 1 1 2 2 3   PHQ - 2 Score 2 2 5 4 5   Altered sleeping 1 1 3 3 2   Tired, decreased energy 2 1 3 2 2   Change in appetite 0 1 1 3 2   Feeling bad or failure about yourself  0 0 3 3 2   Trouble concentrating 0 1 0 1 1  Moving slowly or fidgety/restless 0 0 0 1 1  Suicidal thoughts 0 0 - 1 -  PHQ-9 Score 5 6 15 18 15   Difficult doing work/chores - Somewhat difficult - Somewhat difficult -     .. GAD 7 : Generalized Anxiety Score 09/04/2017 09/03/2017 08/14/2017 08/13/2017  Nervous, Anxious, on Edge 1 1 3 3   Control/stop worrying 1 1 3 3   Worry too much - different things 1 1 2 3   Trouble relaxing 1 0 3 3  Restless 0 0 3 2  Easily annoyed or irritable 1 1 1 3   Afraid - awful might happen 0 1 0 3  Total GAD 7 Score 5 5 15 20   Anxiety Difficulty - Somewhat difficult - Very difficult     Stress Current stressors: Current Stressors: Unknown cause Sleep: Sleep: No problems Appetite: Appetite: No problems Coping ability: Coping ability: Normal Patient taking medications as prescribed: Patient taking medications as prescribed: Yes  Current medications:  Outpatient Encounter Medications as of 09/04/2017  Medication Sig  . amLODipine (NORVASC) 5 MG tablet Take 1 tablet (5 mg total) by mouth daily.  [name] escitalopram (LEXAPRO) 10 MG tablet Take 1 tablet (10 mg total) by mouth daily.  . hydrochlorothiazide (HYDRODIURIL) 25 MG tablet Take 1 tablet (25 mg total) by mouth daily.   No facility-administered encounter medications on file as of 09/04/2017.      Self-harm Behaviors Risk Assessment Self-harm risk factors:   Patient endorses recent thoughts of  harming self: Have you recently had any thoughts about harming yourself?: No  [place] Suicide Severity Rating Scale: No flowsheet data found. No flowsheet data found.   Danger to Others Risk Assessment Danger to others risk factors: Danger to Others Risk Factors: No risk factors noted Patient endorses recent thoughts of harming others: Notification required: No need or identified person  Dynamic Appraisal of Situational Aggression (DASA): No flowsheet data found.    Goals, Interventions and Follow-up Plan Goals: Decrease depression as evidenced by a reduction in PHQ9 and GAD7 scores   Interventions: Motivational interviewing; active listening; brief therapy   Follow-up Plan: Spending more time with her husband and expression her feelings; Walking outside as a means of exercise and stress reduction .   Summary:   Patient reports that the medication is helping a lot.  Patient denies depressive episodes since taking the medication.  Patient reports an increased ability to communicate effectively with her husband.  Patient repots that her sleep is much better and she has a good appetite.    Graciella Freer [place], LCAS-A

## 2017-09-06 ENCOUNTER — Telehealth: Payer: Self-pay | Admitting: Gastroenterology

## 2017-09-06 ENCOUNTER — Other Ambulatory Visit: Payer: Self-pay

## 2017-09-06 MED ORDER — PEG 3350-KCL-NA BICARB-NACL 420 G PO SOLR: 4000.0000 mL | Freq: Once | ORAL | 0 refills | Status: AC

## 2017-09-06 NOTE — Telephone Encounter (Signed)
Pt is calling  Back for her results

## 2017-09-06 NOTE — Telephone Encounter (Signed)
Advised patient of lab results per Dr. Vicente Males.   Sent bowel prep Rx to pharmacy.   Patient states her PCP wanted to know if it was ok to give cholesterol medication. She wasn't sure due to the fatty liver dx.  Sent message to Dr. Vicente Males and cc'd PCP.

## 2017-09-07 ENCOUNTER — Other Ambulatory Visit: Payer: Self-pay | Admitting: Family Medicine

## 2017-09-10 ENCOUNTER — Other Ambulatory Visit: Payer: Self-pay | Admitting: Family Medicine

## 2017-09-10 MED ORDER — ATORVASTATIN CALCIUM 40 MG PO TABS: 40.0000 mg | ORAL_TABLET | Freq: Every day | ORAL | 0 refills | Status: DC

## 2017-09-10 NOTE — Progress Notes (Signed)
Please inform that I have sent in Lipitor for cholesterol.

## 2017-09-19 ENCOUNTER — Telehealth: Payer: Self-pay

## 2017-09-19 NOTE — Telephone Encounter (Signed)
[facility] - left message.  

## 2017-09-24 LAB — HM MAMMOGRAPHY

## 2017-09-26 ENCOUNTER — Encounter: Payer: Self-pay | Admitting: *Deleted

## 2017-09-27 ENCOUNTER — Ambulatory Visit: Payer: Managed Care, Other (non HMO) | Admitting: Anesthesiology

## 2017-09-27 ENCOUNTER — Other Ambulatory Visit: Payer: Self-pay

## 2017-09-27 ENCOUNTER — Encounter: Payer: Self-pay | Admitting: Anesthesiology

## 2017-09-27 ENCOUNTER — Encounter
Admission: RE | Disposition: A | Discharge status: Home / Self Care | Payer: Self-pay | Source: Ambulatory Visit | Attending: Gastroenterology

## 2017-09-27 ENCOUNTER — Ambulatory Visit
Admission: RE | Admit: 2017-09-27 | Discharge: 2017-09-27 | Disposition: A | Discharge status: Home / Self Care | Payer: Managed Care, Other (non HMO) | Source: Ambulatory Visit | Attending: Gastroenterology | Admitting: Gastroenterology

## 2017-09-27 DIAGNOSIS — F419 Anxiety disorder, unspecified: Secondary | ICD-10-CM | POA: Diagnosis not present

## 2017-09-27 DIAGNOSIS — I10 Essential (primary) hypertension: Secondary | ICD-10-CM | POA: Insufficient documentation

## 2017-09-27 DIAGNOSIS — Z79899 Other long term (current) drug therapy: Secondary | ICD-10-CM | POA: Insufficient documentation

## 2017-09-27 DIAGNOSIS — F329 Major depressive disorder, single episode, unspecified: Secondary | ICD-10-CM | POA: Insufficient documentation

## 2017-09-27 DIAGNOSIS — E785 Hyperlipidemia, unspecified: Secondary | ICD-10-CM | POA: Insufficient documentation

## 2017-09-27 DIAGNOSIS — K760 Fatty (change of) liver, not elsewhere classified: Secondary | ICD-10-CM | POA: Insufficient documentation

## 2017-09-27 DIAGNOSIS — F17210 Nicotine dependence, cigarettes, uncomplicated: Secondary | ICD-10-CM | POA: Insufficient documentation

## 2017-09-27 DIAGNOSIS — E538 Deficiency of other specified B group vitamins: Secondary | ICD-10-CM | POA: Diagnosis not present

## 2017-09-27 DIAGNOSIS — D122 Benign neoplasm of ascending colon: Secondary | ICD-10-CM | POA: Insufficient documentation

## 2017-09-27 DIAGNOSIS — Z1211 Encounter for screening for malignant neoplasm of colon: Secondary | ICD-10-CM | POA: Insufficient documentation

## 2017-09-27 DIAGNOSIS — Z800 Family history of malignant neoplasm of digestive organs: Secondary | ICD-10-CM

## 2017-09-27 DIAGNOSIS — Z8249 Family history of ischemic heart disease and other diseases of the circulatory system: Secondary | ICD-10-CM | POA: Insufficient documentation

## 2017-09-27 HISTORY — PX: COLONOSCOPY WITH PROPOFOL: SHX5780

## 2017-09-27 HISTORY — DX: Essential (primary) hypertension: I10

## 2017-09-27 HISTORY — DX: Deficiency of other specified B group vitamins: E53.8

## 2017-09-27 SURGERY — COLONOSCOPY WITH PROPOFOL
Anesthesia: General

## 2017-09-27 MED ORDER — PROPOFOL 10 MG/ML IV BOLUS
INTRAVENOUS | Status: DC | PRN
  Administered 2017-09-27: 100 mg via INTRAVENOUS

## 2017-09-27 MED ORDER — SODIUM CHLORIDE 0.9 % IV SOLN
INTRAVENOUS | Status: DC
  Administered 2017-09-27: 07:00:00 via INTRAVENOUS

## 2017-09-27 MED ORDER — PROPOFOL 10 MG/ML IV BOLUS
INTRAVENOUS | Status: AC
  Filled 2017-09-27: qty 40

## 2017-09-27 MED ORDER — LIDOCAINE HCL (CARDIAC) 20 MG/ML IV SOLN
INTRAVENOUS | Status: DC | PRN
  Administered 2017-09-27: 100 mg via INTRAVENOUS

## 2017-09-27 MED ORDER — PROPOFOL 500 MG/50ML IV EMUL
INTRAVENOUS | Status: DC | PRN
  Administered 2017-09-27: 150 ug/kg/min via INTRAVENOUS
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
## 2017-09-27 NOTE — H&P (Signed)
Jonathon Bellows, MD [address] [place], [place], [place], [address] [address], [place], [place], [place], [address] Phone: [phone]  Fax: [phone]  Primary Care Physician:  Janora Norlander, DO   Pre-Procedure History & Physical: HPI:  Bridget Gutierrez is a 47 y.o. female is here for an colonoscopy.   Past Medical History:  Diagnosis Date  . Anxiety   . B12 deficiency   . Depression   . Fatty liver   . Hyperlipidemia   . Hypertension     Past Surgical History:  Procedure Laterality Date  . CESAREAN SECTION  2000  . TONSILLECTOMY      Prior to Admission medications   Medication Sig Start Date End Date Taking? Authorizing Provider  amLODipine (NORVASC) 5 MG tablet TAKE 1 TABLET BY MOUTH ONCE DAILY 09/10/17  Yes Gottschalk, Ashly M, DO  atorvastatin (LIPITOR) 40 MG tablet Take 1 tablet (40 mg total) by mouth daily. 09/10/17  Yes Ronnie Doss M, DO  cyanocobalamin 500 MCG tablet Take 500 mcg by mouth daily.   Yes [provider]  escitalopram (LEXAPRO) 10 MG tablet TAKE 1 TABLET BY MOUTH ONCE DAILY 09/10/17  Yes Gottschalk, Ashly M, DO  hydrochlorothiazide (HYDRODIURIL) 25 MG tablet Take 1 tablet (25 mg total) by mouth daily. 08/13/17  Yes Ronnie Doss M, DO    Allergies as of 08/21/2017  . (No Known Allergies)    Family History  Problem Relation Age of Onset  . COPD Mother   . Hypertension Mother   . Cancer Mother   . Hyperlipidemia Mother   . Cervical cancer Mother   . Heart disease Mother   . Heart attack Mother   . Hypertension Father   . Cancer Sister   . Colon cancer Sister 46  . Testicular cancer Brother   . Anxiety disorder Daughter     Social History   Socioeconomic History  . Marital status: Married    Spouse name: Not on file  . Number of children: Not on file  . Years of education: Not on file  . Highest education level: Not on file  Occupational History  . Not on file  Social Needs  . Financial resource  strain: Not on file  . Food insecurity:    Worry: Not on file    Inability: Not on file  . Transportation needs:    Medical: Not on file    Non-medical: Not on file  Tobacco Use  . Smoking status: Current Every Day Smoker    Packs/day: 1.50    Years: 15.00    Pack years: 22.50  . Smokeless tobacco: Never Used  Substance and Sexual Activity  . Alcohol use: No    Frequency: Never    Comment: Former 28 cans beer/ week  . Drug use: No  . Sexual activity: Not Currently  Lifestyle  . Physical activity:    Days per week: Not on file    Minutes per session: Not on file  . Stress: Not on file  Relationships  . Social connections:    Talks on phone: Not on file    Gets together: Not on file    Attends religious service: Not on file    Active member of club or organization: Not on file    Attends meetings of clubs or organizations: Not on file    Relationship status: Not on file  . Intimate partner violence:    Fear of current or ex partner: Not  on file    Emotionally abused: Not on file    Physically abused: Not on file    Forced sexual activity: Not on file  Other Topics Concern  . Not on file  Social History Narrative  . Not on file    Review of Systems: See HPI, otherwise negative ROS  Physical Exam: BP 130/85   Pulse 72   Temp (!) 96.9 F (36.1 C) (Tympanic)   Resp 16   Ht 5\' 4"  (1.626 m)   Wt 185 lb (83.9 kg)   LMP 09/17/2017 (Approximate)   SpO2 99%   BMI 31.76 kg/m  General:   Alert,  pleasant and cooperative in NAD Head:  Normocephalic and atraumatic. Neck:  Supple; no masses or thyromegaly. Lungs:  Clear throughout to auscultation, normal respiratory effort.    Heart:  +S1, +S2, Regular rate and rhythm, No edema. Abdomen:  Soft, nontender and nondistended. Normal bowel sounds, without guarding, and without rebound.   Neurologic:  Alert and  oriented x4;  grossly normal neurologically.  Impression/Plan: Bridget Gutierrez is here for an colonoscopy to be  performed for Screening colonoscopy high risk   Risks, benefits, limitations, and alternatives regarding  colonoscopy have been reviewed with the patient.  Questions have been answered.  All parties agreeable.   Jonathon Bellows, MD  09/27/2017, 8:09 AM

## 2017-09-27 NOTE — Op Note (Signed)
[facility] Gastroenterology Patient Name: Bridget Gutierrez Procedure Date: 09/27/2017 8:13 AM MRN: [id number] Account #: [id number] Date of Birth: 10/27/1970 Admit Type: Outpatient Age: 47 Room: [facility] ENDO ROOM 4 Gender: Female Note Status: Finalized Procedure:            Colonoscopy Indications:          Screening in patient at increased risk: Family history                        of 1st-degree relative with colorectal cancer Providers:            Jonathon Bellows MD, MD Referring MD:         Koleen Distance. Lajuana Ripple (Referring MD) Medicines:            Monitored Anesthesia Care Complications:        No immediate complications. Procedure:            Pre-Anesthesia Assessment:                       - Prior to the procedure, a History and Physical was                        performed, and patient medications, allergies and                        sensitivities were reviewed. The patient's tolerance of                        previous anesthesia was reviewed.                       - The risks and benefits of the procedure and the                        sedation options and risks were discussed with the                        patient. All questions were answered and informed                        consent was obtained.                       - ASA Grade Assessment: III - A patient with severe                        systemic disease.                       After obtaining informed consent, the colonoscope was                        passed under direct vision. Throughout the procedure,                        the patient's blood pressure, pulse, and oxygen                        saturations were monitored continuously. The  Colonoscope was introduced through the anus and                        advanced to the the cecum, identified by the                        appendiceal orifice, IC valve and transillumination.                        The colonoscopy was performed  with ease. The patient                        tolerated the procedure well. The quality of the bowel                        preparation was good. Findings:      The perianal and digital rectal examinations were normal.      A 3 mm polyp was found in the ascending colon. The polyp was sessile.       The polyp was removed with a cold biopsy forceps. Resection and       retrieval were complete.      The exam was otherwise without abnormality on direct and retroflexion       views. Impression:           - One 3 mm polyp in the ascending colon, removed with a                        cold biopsy forceps. Resected and retrieved.                       - The examination was otherwise normal on direct and                        retroflexion views. Recommendation:       - Discharge patient to home (with escort).                       - Resume previous diet.                       - Continue present medications.                       - Await pathology results.                       - Repeat colonoscopy in 5 years for surveillance.                       - Return to my office as previously scheduled. Procedure Code(s):    --- Professional ---                       [phone], Colonoscopy, flexible; with biopsy, single or                        multiple Diagnosis Code(s):    --- Professional ---                       Z80.0, Family history of malignant neoplasm of  digestive organs                       D12.2, Benign neoplasm of ascending colon CPT copyright 2016 American Medical Association. All rights reserved. The codes documented in this report are preliminary and upon coder review may  be revised to meet current compliance requirements. Jonathon Bellows, MD Jonathon Bellows MD, MD 09/27/2017 8:33:22 AM This report has been signed electronically. Number of Addenda: 0 Note Initiated On: 09/27/2017 8:13 AM Scope Withdrawal Time: 0 hours 9 minutes 40 seconds  Total Procedure Duration: 0 hours  13 minutes 19 seconds       [facility]

## 2017-09-27 NOTE — Anesthesia Post-op Follow-up Note (Signed)
Anesthesia QCDR form completed.        

## 2017-09-27 NOTE — Anesthesia Postprocedure Evaluation (Signed)
Anesthesia Post Note  Patient: Bridget Gutierrez  Procedure(s) Performed: COLONOSCOPY WITH PROPOFOL (N/A )  Patient location during evaluation: Endoscopy Anesthesia Type: General Level of consciousness: awake and alert Pain management: pain level controlled Vital Signs Assessment: post-procedure vital signs reviewed and stable Respiratory status: spontaneous breathing, nonlabored ventilation, respiratory function stable and patient connected to nasal cannula oxygen Cardiovascular status: blood pressure returned to baseline and stable Postop Assessment: no apparent nausea or vomiting Anesthetic complications: no     Last Vitals:  Vitals:   09/27/17 0835 09/27/17 0905  BP: 112/69 111/75  Pulse:    Resp:    Temp: (!) 36.1 C   SpO2:      Last Pain:  Vitals:   09/27/17 0845  TempSrc:   PainSc: 0-No pain                 Tresa Jolley S

## 2017-09-27 NOTE — Transfer of Care (Signed)
Immediate Anesthesia Transfer of Care Note  Patient: Bridget Gutierrez  Procedure(s) Performed: COLONOSCOPY WITH PROPOFOL (N/A )  Patient Location: PACU  Anesthesia Type:General  Level of Consciousness: awake, alert  and oriented  Airway & Oxygen Therapy: Patient Spontanous Breathing and Patient connected to nasal cannula oxygen  Post-op Assessment: Report given to RN and Post -op Vital signs reviewed and stable  Post vital signs: Reviewed and stable  Last Vitals:  Vitals Value Taken Time  BP    Temp 36.1 C 09/27/2017  8:35 AM  Pulse    Resp    SpO2      Last Pain:  Vitals:   09/27/17 0835  TempSrc: Tympanic  PainSc:          Complications: No apparent anesthesia complications

## 2017-09-27 NOTE — Anesthesia Postprocedure Evaluation (Signed)
Anesthesia Post Note  Patient: Bridget Gutierrez  Procedure(s) Performed: COLONOSCOPY WITH PROPOFOL (N/A )  Patient location during evaluation: Endoscopy Anesthesia Type: General Level of consciousness: awake and alert Pain management: pain level controlled Vital Signs Assessment: post-procedure vital signs reviewed and stable Respiratory status: spontaneous breathing, nonlabored ventilation, respiratory function stable and patient connected to nasal cannula oxygen Cardiovascular status: blood pressure returned to baseline and stable Postop Assessment: no apparent nausea or vomiting Anesthetic complications: no     Last Vitals:  Vitals:   09/27/17 0835 09/27/17 0905  BP: 112/69 111/75  Pulse:    Resp:    Temp: (!) 36.1 C   SpO2:      Last Pain:  Vitals:   09/27/17 0845  TempSrc:   PainSc: 0-No pain                 Phelan Schadt S

## 2017-09-28 ENCOUNTER — Encounter: Payer: Self-pay | Admitting: Family Medicine

## 2017-09-28 LAB — SURGICAL PATHOLOGY

## 2017-09-30 ENCOUNTER — Encounter: Payer: Self-pay | Admitting: Gastroenterology

## 2017-10-01 ENCOUNTER — Other Ambulatory Visit: Payer: Self-pay

## 2017-10-01 ENCOUNTER — Ambulatory Visit: Payer: Managed Care, Other (non HMO) | Admitting: Gastroenterology

## 2017-10-01 ENCOUNTER — Encounter: Payer: Self-pay | Admitting: Gastroenterology

## 2017-10-01 ENCOUNTER — Encounter (INDEPENDENT_AMBULATORY_CARE_PROVIDER_SITE_OTHER): Payer: Self-pay

## 2017-10-01 VITALS — BP 116/77 | HR 71 | Temp 98.1°F | Ht 64.0 in | Wt 183.6 lb

## 2017-10-01 DIAGNOSIS — K760 Fatty (change of) liver, not elsewhere classified: Secondary | ICD-10-CM

## 2017-10-01 NOTE — Progress Notes (Signed)
Jonathon Bellows MD, MRCP(U.K) [address]  [place]  [place], [place] [address]  Main: [phone]  Fax: [phone]   Primary Care Physician: Janora Norlander, DO  Primary Gastroenterologist:  Dr. Jonathon Bellows   No chief complaint on file.   HPI: Bridget Gutierrez is a 47 y.o. female   Summary of history :  She is here to follow up for abnormal LFT's hospital/o fatty liver , quit excess alcohol in 07/2017 .sisters had colon cancer. Some constipation .  Interval history   08/20/2016-  10/01/2017  08/28/17 : RUQ USG fatty liver , sludge in gall bladder and 5 mm gall stone.   Labs 08/2017 : BMP-normal , B12 316 ,   Hep A ab - negative .Immune to hep B.HCv,HIV negative .Ceruloplasmin,A1AT,LKM,CK,PTH,ANA- normal   Colonoscopy 08/2017 - 3 mm tubular adenoma excised.   Hepatic Function Latest Ref Rng & Units 08/28/2017 07/27/2017  Total Protein 6.5 - 8.1 g/dL 7.9 7.7  Albumin 3.5 - 5.0 g/dL 4.4 4.5  AST 15 - 41 U/L 35 41(H)  ALT 14 - 54 U/L 59(H) 52(H)  Alk Phosphatase 38 - 126 U/L 97 125(H)  Total Bilirubin 0.3 - 1.2 mg/dL 0.5 0.2   Stopped all alcohol, eating healthy , quit processed foods, cut down on grease.   Not taking miralax and is having constipation.   Current Outpatient Medications  Medication Sig Dispense Refill  . amLODipine (NORVASC) 5 MG tablet TAKE 1 TABLET BY MOUTH ONCE DAILY 90 tablet 0  . atorvastatin (LIPITOR) 40 MG tablet Take 1 tablet (40 mg total) by mouth daily. 90 tablet 0  . cyanocobalamin 500 MCG tablet Take 500 mcg by mouth daily.    [name] escitalopram (LEXAPRO) 10 MG tablet TAKE 1 TABLET BY MOUTH ONCE DAILY 90 tablet 0  . hydrochlorothiazide (HYDRODIURIL) 25 MG tablet Take 1 tablet (25 mg total) by mouth daily. 30 tablet 5   No current facility-administered medications for this visit.     Allergies as of 10/01/2017  . (No Known Allergies)    ROS:  General: Negative for anorexia, weight loss, fever, chills, fatigue, weakness. ENT: Negative for  hoarseness, difficulty swallowing , nasal congestion. CV: Negative for chest pain, angina, palpitations, dyspnea on exertion, peripheral edema.  Respiratory: Negative for dyspnea at rest, dyspnea on exertion, cough, sputum, wheezing.  GI: See history of present illness. GU:  Negative for dysuria, hematuria, urinary incontinence, urinary frequency, nocturnal urination.  Endo: Negative for unusual weight change.    Physical Examination:   LMP 09/17/2017 (Approximate)   General: Well-nourished, well-developed in no acute distress.  Eyes: No icterus. Conjunctivae pink. Mouth: Oropharyngeal mucosa moist and pink , no lesions erythema or exudate. Lungs: Clear to auscultation bilaterally. Non-labored. Heart: Regular rate and rhythm, no murmurs rubs or gallops.  Abdomen: Bowel sounds are normal, nontender, nondistended, no hepatosplenomegaly or masses, no abdominal bruits or hernia , no rebound or guarding.   Extremities: No lower extremity edema. No clubbing or deformities. Neuro: Alert and oriented x 3.  Grossly intact. Skin: Warm and dry, no jaundice.   Psych: Alert and cooperative, normal mood and affect.   Imaging Studies: No results found.  Assessment and Plan:   Bridget Gutierrez is a 47 y.o. y/o female here to follow up for elevated liver function tests most likely secondary to alcoholic or non alcoholic fatty liver disease. [name] LFT's already improving since she quit alcohol and changed lifestyle.   Plan  1. Miralax for constipation  2. Vaccine for Hep  3. Stay off all alcohol.  4. Stop smoking  5. Mediterranean diet print out  6. Continue life style changes as discussed today for fatty liver.    Dr Jonathon Bellows  MD,MRCP [facility]) Follow up in 1 year.

## 2017-11-16 ENCOUNTER — Telehealth: Payer: Self-pay | Admitting: Licensed Clinical Social Worker

## 2017-11-16 NOTE — Telephone Encounter (Signed)
This [facility] specialist left message to call back with name and contact information. 

## 2017-12-05 ENCOUNTER — Other Ambulatory Visit: Payer: Self-pay | Admitting: Family Medicine

## 2017-12-05 NOTE — Telephone Encounter (Signed)
Ov 12/12/17. 

## 2017-12-12 ENCOUNTER — Ambulatory Visit: Payer: Managed Care, Other (non HMO) | Admitting: Family Medicine

## 2017-12-14 NOTE — Progress Notes (Signed)
Subjective: CC: HTN, GAD/Depression PCP: Janora Norlander, DO Bridget Gutierrez is a 47 y.o. female presenting to clinic today for:  1. Hypertension/ HLD Patient reports that she has been doing well since last visit; Meds: Compliant with Norvasc and hydrochlorothiazide, Side effects: None.  She has been taking the Lipitor as directed.  She is been working on weight loss as well.  She continues to have the lesions on her face and wonders if this will resolve with use of Lipitor.  ROS: Denies headache, dizziness, visual changes, nausea, vomiting, chest pain, LE swelling, abdominal pain or shortness of breath.  2. GAD/Depression Symptoms are well controlled on Lexapro 10 mg.  Denies any concerns today.  No SI, HI.  No GI side effects.  3.  Numbness and tingling in the hands and feet Patient reports that the numbness and tingling in her feet have actually improved on the OTC vitamin B 12 but that she continues to have prominent bilateral, right greater than left, hand numbness and tingling, particularly in the morning.  She has a history of ganglion cyst in the right wrist.  She does not want to see orthopedics for injections nor release of the carpal tunnel at this time.   ROS: Per HPI  No Known Allergies Past Medical History:  Diagnosis Date  . Anxiety   . B12 deficiency   . Depression   . Fatty liver   . Hyperlipidemia   . Hypertension   . Tubular adenoma of colon 2019    Current Outpatient Medications:  .  amLODipine (NORVASC) 5 MG tablet, TAKE 1 TABLET BY MOUTH ONCE DAILY, Disp: 90 tablet, Rfl: 0 .  atorvastatin (LIPITOR) 40 MG tablet, TAKE 1 TABLET BY MOUTH ONCE DAILY, Disp: 90 tablet, Rfl: 0 .  cyanocobalamin 500 MCG tablet, Take 500 mcg by mouth daily., Disp: , Rfl:  .  escitalopram (LEXAPRO) 10 MG tablet, TAKE 1 TABLET BY MOUTH ONCE DAILY, Disp: 90 tablet, Rfl: 0 .  hydrochlorothiazide (HYDRODIURIL) 25 MG tablet, Take 1 tablet (25 mg total) by mouth daily., Disp: 30  tablet, Rfl: 5 Social History   Socioeconomic History  . Marital status: Married    Spouse name: Not on file  . Number of children: Not on file  . Years of education: Not on file  . Highest education level: Not on file  Occupational History  . Not on file  Social Needs  . Financial resource strain: Not on file  . Food insecurity:    Worry: Not on file    Inability: Not on file  . Transportation needs:    Medical: Not on file    Non-medical: Not on file  Tobacco Use  . Smoking status: Current Every Day Smoker    Packs/day: 1.50    Years: 15.00    Pack years: 22.50  . Smokeless tobacco: Never Used  Substance and Sexual Activity  . Alcohol use: No    Frequency: Never    Comment: Former 28 cans beer/ week  . Drug use: No  . Sexual activity: Not Currently  Lifestyle  . Physical activity:    Days per week: Not on file    Minutes per session: Not on file  . Stress: Not on file  Relationships  . Social connections:    Talks on phone: Not on file    Gets together: Not on file    Attends religious service: Not on file    Active member of club or organization: Not on  file    Attends meetings of clubs or organizations: Not on file    Relationship status: Not on file  . Intimate partner violence:    Fear of current or ex partner: Not on file    Emotionally abused: Not on file    Physically abused: Not on file    Forced sexual activity: Not on file  Other Topics Concern  . Not on file  Social History Narrative  . Not on file   Family History  Problem Relation Age of Onset  . COPD Mother   . Hypertension Mother   . Cancer Mother   . Hyperlipidemia Mother   . Cervical cancer Mother   . Heart disease Mother   . Heart attack Mother   . Hypertension Father   . Cancer Sister   . Colon cancer Sister 51  . Testicular cancer Brother   . Anxiety disorder Daughter     Objective: Office vital signs reviewed. BP 122/83   Pulse 82   Temp 97.9 F (36.6 C) (Oral)   Ht 5'  4" (1.626 m)   Wt 178 lb (80.7 kg)   LMP 09/17/2017 (Approximate)   BMI 30.55 kg/m   Physical Examination:  General: Awake, alert, well nourished, No acute distress HEENT: Normal    Eyes: PERRLA, extraocular membranes intact, sclera white    Throat: moist mucus membranes Cardio: regular rate and rhythm, S1S2 heard, no murmurs appreciated Pulm: clear to auscultation bilaterally, no wheezes, rhonchi or rales; normal work of breathing on room air Extremities: warm, well perfused, No edema, cyanosis or clubbing; +2 pulses bilaterally MSK: normal gait and normal station; no swelling or masses appreciated within the wrists.  She has full active range of motion. Skin: dry; intact; no rashes or lesions Psych: Mood stable, speech normal, affect appropriate, pleasant, interactive Depression screen [facility] 2/9 12/17/2017 09/04/2017 09/03/2017  Decreased Interest 0 1 1  Down, Depressed, Hopeless 1 1 1   PHQ - 2 Score 1 2 2   Altered sleeping - 1 1  Tired, decreased energy - 2 1  Change in appetite - 0 1  Feeling bad or failure about yourself  - 0 0  Trouble concentrating - 0 1  Moving slowly or fidgety/restless - 0 0  Suicidal thoughts - 0 0  PHQ-9 Score - 5 6  Difficult doing work/chores - - Somewhat difficult   GAD 7 : Generalized Anxiety Score 12/17/2017 09/04/2017 09/03/2017 08/14/2017  Nervous, Anxious, on Edge 0 1 1 3   Control/stop worrying 0 1 1 3   Worry too much - different things 0 1 1 2   Trouble relaxing 0 1 0 3  Restless 0 0 0 3  Easily annoyed or irritable 1 1 1 1   Afraid - awful might happen 1 0 1 0  Total GAD 7 Score 2 5 5 15   Anxiety Difficulty Somewhat difficult - Somewhat difficult -    Assessment/ Plan: 47 y.o. female   1. Familial hyperlipidemia Continue Lipitor.  May need to add Vascepa or fenofibrate if triglycerides are high. - Lipid Panel  2. Essential hypertension Controlled on current regimen.  This is been refilled x1 year - Lipid Panel  3. Fatty liver disease,  nonalcoholic As above - Lipid Panel  4. Numbness and tingling of both feet We will check vitamin B12 level.  However, I think sensation in her wrists and hands are related to carpal tunnel not B12 deficiency. - Vitamin B12   Orders Placed This Encounter  Procedures  .  Lipid Panel  . Vitamin B12   Meds ordered this encounter  Medications  . amLODipine (NORVASC) 5 MG tablet    Sig: Take 1 tablet (5 mg total) by mouth daily.    Dispense:  90 tablet    Refill:  3  . atorvastatin (LIPITOR) 40 MG tablet    Sig: Take 1 tablet (40 mg total) by mouth daily.    Dispense:  90 tablet    Refill:  3  . escitalopram (LEXAPRO) 10 MG tablet    Sig: Take 1 tablet (10 mg total) by mouth daily.    Dispense:  90 tablet    Refill:  3  . hydrochlorothiazide (HYDRODIURIL) 25 MG tablet    Sig: Take 1 tablet (25 mg total) by mouth daily.    Dispense:  90 tablet    Refill:  [place], [place] [phone]

## 2017-12-17 ENCOUNTER — Telehealth: Payer: Self-pay

## 2017-12-17 ENCOUNTER — Telehealth: Payer: Self-pay | Admitting: Family Medicine

## 2017-12-17 ENCOUNTER — Ambulatory Visit: Payer: Managed Care, Other (non HMO) | Admitting: Family Medicine

## 2017-12-17 ENCOUNTER — Encounter: Payer: Self-pay | Admitting: Family Medicine

## 2017-12-17 VITALS — BP 122/83 | HR 82 | Temp 97.9°F | Ht 64.0 in | Wt 178.0 lb

## 2017-12-17 DIAGNOSIS — R200 Anesthesia of skin: Secondary | ICD-10-CM | POA: Diagnosis not present

## 2017-12-17 DIAGNOSIS — I10 Essential (primary) hypertension: Secondary | ICD-10-CM

## 2017-12-17 DIAGNOSIS — E7849 Other hyperlipidemia: Secondary | ICD-10-CM | POA: Diagnosis not present

## 2017-12-17 DIAGNOSIS — K760 Fatty (change of) liver, not elsewhere classified: Secondary | ICD-10-CM | POA: Diagnosis not present

## 2017-12-17 DIAGNOSIS — R202 Paresthesia of skin: Secondary | ICD-10-CM | POA: Diagnosis not present

## 2017-12-17 MED ORDER — HYDROCHLOROTHIAZIDE 25 MG PO TABS: 25.0000 mg | ORAL_TABLET | Freq: Every day | ORAL | 3 refills | Status: DC

## 2017-12-17 MED ORDER — ATORVASTATIN CALCIUM 40 MG PO TABS: 40.0000 mg | ORAL_TABLET | Freq: Every day | ORAL | 3 refills | Status: DC

## 2017-12-17 MED ORDER — AMLODIPINE BESYLATE 5 MG PO TABS: 5.0000 mg | ORAL_TABLET | Freq: Every day | ORAL | 3 refills | Status: DC

## 2017-12-17 MED ORDER — ESCITALOPRAM OXALATE 10 MG PO TABS: 10.0000 mg | ORAL_TABLET | Freq: Every day | ORAL | 3 refills | Status: DC

## 2017-12-17 NOTE — Telephone Encounter (Signed)
[facility] - Left Message  

## 2017-12-17 NOTE — Telephone Encounter (Signed)
Answered questions.  

## 2017-12-17 NOTE — Patient Instructions (Signed)
You had labs performed today.  You will be contacted with the results of the labs once they are available, usually in the next 3 business days for routine lab work.  If you had a pap smear or biopsy performed, expect to be contacted in about 7-10 days.  

## 2017-12-18 LAB — VITAMIN B12

## 2017-12-18 LAB — LIPID PANEL
CHOL/HDL RATIO: 10.5 ratio — AB (ref 0.0–4.4)
Cholesterol, Total: 346 mg/dL — ABNORMAL HIGH (ref 100–199)
HDL: 33 mg/dL — ABNORMAL LOW (ref >39)
LDL CALC: 243 mg/dL — AB (ref 0–99)
Triglycerides: 349 mg/dL — ABNORMAL HIGH (ref 0–149)
VLDL Cholesterol Cal: 70 mg/dL — ABNORMAL HIGH (ref 5–40)

## 2017-12-20 NOTE — Telephone Encounter (Signed)
Error in charting.

## 2018-03-06 ENCOUNTER — Ambulatory Visit: Payer: Managed Care, Other (non HMO) | Admitting: Family Medicine

## 2018-06-19 ENCOUNTER — Ambulatory Visit: Payer: Managed Care, Other (non HMO) | Admitting: Family Medicine

## 2018-06-19 ENCOUNTER — Encounter: Payer: Self-pay | Admitting: Family Medicine

## 2018-06-19 VITALS — BP 126/87 | HR 79 | Temp 97.6°F | Ht 64.0 in | Wt 184.0 lb

## 2018-06-19 DIAGNOSIS — I10 Essential (primary) hypertension: Secondary | ICD-10-CM | POA: Diagnosis not present

## 2018-06-19 DIAGNOSIS — K760 Fatty (change of) liver, not elsewhere classified: Secondary | ICD-10-CM

## 2018-06-19 DIAGNOSIS — E7849 Other hyperlipidemia: Secondary | ICD-10-CM | POA: Diagnosis not present

## 2018-06-19 DIAGNOSIS — F17200 Nicotine dependence, unspecified, uncomplicated: Secondary | ICD-10-CM | POA: Diagnosis not present

## 2018-06-19 DIAGNOSIS — M792 Neuralgia and neuritis, unspecified: Secondary | ICD-10-CM

## 2018-06-19 LAB — CMP14+EGFR
A/G RATIO: 1.9 (ref 1.2–2.2)
ALK PHOS: 140 IU/L — AB (ref 39–117)
ALT: 26 IU/L (ref 0–32)
AST: 17 IU/L (ref 0–40)
Albumin: 4.8 g/dL (ref 3.5–5.5)
BILIRUBIN TOTAL: 0.2 mg/dL (ref 0.0–1.2)
BUN/Creatinine Ratio: 15 (ref 9–23)
BUN: 12 mg/dL (ref 6–24)
CHLORIDE: 99 mmol/L (ref 96–106)
CO2: 25 mmol/L (ref 20–29)
Calcium: 10.5 mg/dL — ABNORMAL HIGH (ref 8.7–10.2)
Creatinine, Ser: 0.8 mg/dL (ref 0.57–1.00)
GFR calc non Af Amer: 88 mL/min/{1.73_m2} (ref >59)
GFR, EST AFRICAN AMERICAN: 102 mL/min/{1.73_m2} (ref >59)
Globulin, Total: 2.5 g/dL (ref 1.5–4.5)
Glucose: 83 mg/dL (ref 65–99)
POTASSIUM: 4 mmol/L (ref 3.5–5.2)
Sodium: 140 mmol/L (ref 134–144)
Total Protein: 7.3 g/dL (ref 6.0–8.5)

## 2018-06-19 LAB — LIPID PANEL
CHOL/HDL RATIO: 9.6 ratio — AB (ref 0.0–4.4)
Cholesterol, Total: 354 mg/dL — ABNORMAL HIGH (ref 100–199)
HDL: 37 mg/dL — AB (ref >39)
LDL CALC: 274 mg/dL — AB (ref 0–99)
Triglycerides: 216 mg/dL — ABNORMAL HIGH (ref 0–149)
VLDL Cholesterol Cal: 43 mg/dL — ABNORMAL HIGH (ref 5–40)

## 2018-06-19 MED ORDER — VARENICLINE TARTRATE 0.5 MG X 11 & 1 MG X 42 PO MISC: ORAL | 0 refills | Status: DC

## 2018-06-19 MED ORDER — ICOSAPENT ETHYL 1 G PO CAPS: 2.0000 | ORAL_CAPSULE | Freq: Two times a day (BID) | ORAL | 0 refills | Status: DC

## 2018-06-19 MED ORDER — VARENICLINE TARTRATE 1 MG PO TABS: 1.0000 mg | ORAL_TABLET | Freq: Two times a day (BID) | ORAL | 2 refills | Status: DC

## 2018-06-19 NOTE — Patient Instructions (Signed)
I will call you with your lab results Friday.  If the Vascepa samples are going well, I will prescribe them.  Continue your other cholesterol medication as well.  You were given a script for chantix today.  See me in 3 months for cholesterol check.

## 2018-06-19 NOTE — Progress Notes (Signed)
Subjective: CC: HTN, HLD PCP: Janora Norlander, DO Bridget Gutierrez is a 47 y.o. female presenting to clinic today for:  1. Hypertension/ HLD/tobacco use disorder Patient reports compliance with her Norvasc and hydrochlorothiazide.  She admits that she has added some salt back into her diet and has gained a little bit of weight but overall blood pressures have continued to be controlled.  Denies any chest pain, shortness of breath, lower extremity edema or visual disturbance.  She reports compliance with Lipitor 40 mg daily.  She is continue to stain from alcohol.  She does smoke and she is interested in smoking cessation.  She has never tried Chantix.  She has tried and failed gum and nicotine patches.  The nicotine patches gave her nightmares.  2. GAD/Depression Patient reports that symptoms are well controlled on Lexapro 10 mg.  No SI, HI.  She has enough refills.  3.  Burning in hands and feet Patient reports longstanding history of numbness tingling in bilateral hands and feet.  She notes that they tend to burn at nighttime.  She has had nerve conduction studies in the past.  She is never been on gabapentin.  She does have a known ganglion cyst in the right wrist.  She feels like she has decreased hand grip bilaterally.  She would be amenable to seeing a specialist.  ROS: Per HPI  No Known Allergies Past Medical History:  Diagnosis Date  . Anxiety   . B12 deficiency   . Depression   . Fatty liver   . Hyperlipidemia   . Hypertension   . Tubular adenoma of colon 2019    Current Outpatient Medications:  .  amLODipine (NORVASC) 5 MG tablet, Take 1 tablet (5 mg total) by mouth daily., Disp: 90 tablet, Rfl: 3 .  atorvastatin (LIPITOR) 40 MG tablet, Take 1 tablet (40 mg total) by mouth daily., Disp: 90 tablet, Rfl: 3 .  cyanocobalamin 500 MCG tablet, Take 1,000 mcg by mouth daily. , Disp: , Rfl:  .  escitalopram (LEXAPRO) 10 MG tablet, Take 1 tablet (10 mg total) by mouth daily.,  Disp: 90 tablet, Rfl: 3 .  hydrochlorothiazide (HYDRODIURIL) 25 MG tablet, Take 1 tablet (25 mg total) by mouth daily., Disp: 90 tablet, Rfl: 3 Social History   Socioeconomic History  . Marital status: Married    Spouse name: Not on file  . Number of children: Not on file  . Years of education: Not on file  . Highest education level: Not on file  Occupational History  . Not on file  Social Needs  . Financial resource strain: Not on file  . Food insecurity:    Worry: Not on file    Inability: Not on file  . Transportation needs:    Medical: Not on file    Non-medical: Not on file  Tobacco Use  . Smoking status: Current Every Day Smoker    Packs/day: 1.50    Years: 15.00    Pack years: 22.50  . Smokeless tobacco: Never Used  Substance and Sexual Activity  . Alcohol use: No    Frequency: Never    Comment: Former 28 cans beer/ week  . Drug use: No  . Sexual activity: Not Currently  Lifestyle  . Physical activity:    Days per week: Not on file    Minutes per session: Not on file  . Stress: Not on file  Relationships  . Social connections:    Talks on phone: Not on file  Gets together: Not on file    Attends religious service: Not on file    Active member of club or organization: Not on file    Attends meetings of clubs or organizations: Not on file    Relationship status: Not on file  . Intimate partner violence:    Fear of current or ex partner: Not on file    Emotionally abused: Not on file    Physically abused: Not on file    Forced sexual activity: Not on file  Other Topics Concern  . Not on file  Social History Narrative  . Not on file   Family History  Problem Relation Age of Onset  . COPD Mother   . Hypertension Mother   . Cancer Mother   . Hyperlipidemia Mother   . Cervical cancer Mother   . Heart disease Mother   . Heart attack Mother   . Hypertension Father   . Cancer Sister   . Colon cancer Sister 69  . Testicular cancer Brother   . Anxiety  disorder Daughter     Objective: Office vital signs reviewed. BP 126/87   Pulse 79   Temp 97.6 F (36.4 C) (Oral)   Ht _0  (1.626 m)   Wt 184 lb (83.5 kg)   BMI 31.58 kg/m   Physical Examination:  General: Awake, alert, well nourished, No acute distress HEENT: Normal, sclera white, MMM Cardio: regular rate and rhythm, S1S2 heard, no murmurs appreciated Pulm: clear to auscultation bilaterally, no wheezes, rhonchi or rales; normal work of breathing on room air Extremities: warm, well perfused, No edema, cyanosis or clubbing; +2 pulses bilaterally MSK: normal gait and normal station; she is a small ganglion cyst on the ventral aspect of the right wrist.   Psych: Mood stable, speech normal, affect appropriate, pleasant, interactive  Depression screen [facility] 2/9 06/19/2018 12/17/2017 09/04/2017  Decreased Interest 0 0 1  Down, Depressed, Hopeless 0 1 1  PHQ - 2 Score 0 1 2  Altered sleeping 0 - 1  Tired, decreased energy 0 - 2  Change in appetite 0 - 0  Feeling bad or failure about yourself  0 - 0  Trouble concentrating 0 - 0  Moving slowly or fidgety/restless 0 - 0  Suicidal thoughts 0 - 0  PHQ-9 Score 0 - 5  Difficult doing work/chores Not difficult at all - -    Assessment/ Plan: 47 y.o. female   1. Essential hypertension Under excellent control on current regimen.  No changes made. - CMP14+EGFR  2. Fatty liver disease, nonalcoholic Check lipid panel and CMP.  History of elevated LFTs that is under the care of gastroenterology currently.  We will add Vascepa given hypertriglyceridemia and known familial hyperlipidemia.  Lipids have been persistently elevated despite use of statin, weight loss and diet modification.  She has known family history of cardiovascular disease in her mother, who has undergone several stents. - Icosapent Ethyl (VASCEPA) 1 g CAPS; Take 2 capsules (2 g total) by mouth 2 (two) times daily with a meal.  Dispense: 16 capsule; Refill: 0 - Lipid Panel -  CMP14+EGFR  3. Familial hyperlipidemia - Icosapent Ethyl (VASCEPA) 1 g CAPS; Take 2 capsules (2 g total) by mouth 2 (two) times daily with a meal.  Dispense: 16 capsule; Refill: 0 - Lipid Panel - CMP14+EGFR  4. Tobacco use disorder In the action phase of smoking cessation.  She is ready to start Chantix.  Prescriptions have been sent.  Possible side effects reviewed  with the patient.  Coupon provided as well today.  5. Peripheral neuralgia Possibly related to alcohol use in the past.  We checked her B12 in June and it was well above normal limits.  We discussed trial of gabapentin versus referral.  I will send to PM&R for further evaluation per her request. - Ambulatory referral to Physical Medicine Rehab   Orders Placed This Encounter  Procedures  . Lipid Panel  . CMP14+EGFR  . Ambulatory referral to Physical Medicine Rehab    Referral Priority:   Routine    Referral Type:   Rehabilitation    Referral Reason:   Specialty Services Required    Requested Specialty:   Physical Medicine and Rehabilitation    Number of Visits Requested:   1   Meds ordered this encounter  Medications  . Icosapent Ethyl (VASCEPA) 1 g CAPS    Sig: Take 2 capsules (2 g total) by mouth 2 (two) times daily with a meal.    Dispense:  16 capsule    Refill:  0  . varenicline (CHANTIX STARTING MONTH PAK) 0.5 MG X 11 & 1 MG X 42 tablet    Sig: Take 0.5 mg tablet by mouth once daily x3 days, then 0.5 mg tablet twice daily x4 days, then increase to one 1 mg tablet twice daily.    Dispense:  53 tablet    Refill:  0  . varenicline (CHANTIX CONTINUING MONTH PAK) 1 MG tablet    Sig: Take 1 tablet (1 mg total) by mouth 2 (two) times daily.    Dispense:  60 tablet    Refill:  [place], [place] [phone]

## 2018-06-21 ENCOUNTER — Other Ambulatory Visit: Payer: Self-pay | Admitting: Family Medicine

## 2018-06-21 DIAGNOSIS — K760 Fatty (change of) liver, not elsewhere classified: Secondary | ICD-10-CM

## 2018-06-21 DIAGNOSIS — E7849 Other hyperlipidemia: Secondary | ICD-10-CM

## 2018-06-21 MED ORDER — ICOSAPENT ETHYL 1 G PO CAPS: 2.0000 | ORAL_CAPSULE | Freq: Two times a day (BID) | ORAL | 3 refills | Status: DC

## 2018-07-01 ENCOUNTER — Other Ambulatory Visit: Payer: Self-pay | Admitting: Family Medicine

## 2018-07-01 MED ORDER — EZETIMIBE 10 MG PO TABS: 10.0000 mg | ORAL_TABLET | Freq: Every day | ORAL | 3 refills | Status: DC

## 2018-11-07 ENCOUNTER — Telehealth: Payer: Self-pay | Admitting: Family Medicine

## 2018-12-20 ENCOUNTER — Ambulatory Visit: Payer: Managed Care, Other (non HMO) | Admitting: Family Medicine

## 2018-12-27 ENCOUNTER — Other Ambulatory Visit: Payer: Self-pay

## 2018-12-30 ENCOUNTER — Other Ambulatory Visit: Payer: Self-pay

## 2018-12-30 ENCOUNTER — Ambulatory Visit: Payer: Managed Care, Other (non HMO) | Admitting: Family Medicine

## 2018-12-30 ENCOUNTER — Encounter: Payer: Self-pay | Admitting: Family Medicine

## 2018-12-30 VITALS — BP 120/82 | HR 74 | Temp 97.4°F | Ht 64.0 in | Wt 183.6 lb

## 2018-12-30 DIAGNOSIS — F419 Anxiety disorder, unspecified: Secondary | ICD-10-CM

## 2018-12-30 DIAGNOSIS — I10 Essential (primary) hypertension: Secondary | ICD-10-CM | POA: Diagnosis not present

## 2018-12-30 DIAGNOSIS — K760 Fatty (change of) liver, not elsewhere classified: Secondary | ICD-10-CM | POA: Diagnosis not present

## 2018-12-30 DIAGNOSIS — F17200 Nicotine dependence, unspecified, uncomplicated: Secondary | ICD-10-CM | POA: Diagnosis not present

## 2018-12-30 DIAGNOSIS — M792 Neuralgia and neuritis, unspecified: Secondary | ICD-10-CM

## 2018-12-30 DIAGNOSIS — F329 Major depressive disorder, single episode, unspecified: Secondary | ICD-10-CM

## 2018-12-30 DIAGNOSIS — N951 Menopausal and female climacteric states: Secondary | ICD-10-CM

## 2018-12-30 LAB — CMP14+EGFR
ALT: 28 IU/L (ref 0–32)
AST: 20 IU/L (ref 0–40)
Albumin/Globulin Ratio: 1.7 (ref 1.2–2.2)
Albumin: 4.4 g/dL (ref 3.8–4.8)
Alkaline Phosphatase: 135 IU/L — ABNORMAL HIGH (ref 39–117)
BUN/Creatinine Ratio: 16 (ref 9–23)
BUN: 12 mg/dL (ref 6–24)
Bilirubin Total: 0.2 mg/dL (ref 0.0–1.2)
CO2: 24 mmol/L (ref 20–29)
Calcium: 9.9 mg/dL (ref 8.7–10.2)
Chloride: 99 mmol/L (ref 96–106)
Creatinine, Ser: 0.73 mg/dL (ref 0.57–1.00)
GFR calc Af Amer: 113 mL/min/{1.73_m2} (ref >59)
GFR calc non Af Amer: 98 mL/min/{1.73_m2} (ref >59)
Globulin, Total: 2.6 g/dL (ref 1.5–4.5)
Glucose: 100 mg/dL — ABNORMAL HIGH (ref 65–99)
Potassium: 3.1 mmol/L — ABNORMAL LOW (ref 3.5–5.2)
Sodium: 139 mmol/L (ref 134–144)
Total Protein: 7 g/dL (ref 6.0–8.5)

## 2018-12-30 LAB — LIPID PANEL
Chol/HDL Ratio: 7.5 ratio — ABNORMAL HIGH (ref 0.0–4.4)
Cholesterol, Total: 264 mg/dL — ABNORMAL HIGH (ref 100–199)
HDL: 35 mg/dL — ABNORMAL LOW (ref >39)
LDL Calculated: 198 mg/dL — ABNORMAL HIGH (ref 0–99)
Triglycerides: 153 mg/dL — ABNORMAL HIGH (ref 0–149)
VLDL Cholesterol Cal: 31 mg/dL (ref 5–40)

## 2018-12-30 MED ORDER — HYDROCHLOROTHIAZIDE 25 MG PO TABS: 25.0000 mg | ORAL_TABLET | Freq: Every day | ORAL | 3 refills | Status: DC

## 2018-12-30 MED ORDER — ESCITALOPRAM OXALATE 10 MG PO TABS: 10.0000 mg | ORAL_TABLET | Freq: Every day | ORAL | 3 refills | Status: DC

## 2018-12-30 MED ORDER — ATORVASTATIN CALCIUM 40 MG PO TABS: 40.0000 mg | ORAL_TABLET | Freq: Every day | ORAL | 3 refills | Status: DC

## 2018-12-30 MED ORDER — GABAPENTIN 300 MG PO CAPS: ORAL_CAPSULE | ORAL | 3 refills | Status: DC

## 2018-12-30 MED ORDER — NICOTINE 7 MG/24HR TD PT24: 7.0000 mg | MEDICATED_PATCH | Freq: Every day | TRANSDERMAL | 0 refills | Status: DC

## 2018-12-30 MED ORDER — AMLODIPINE BESYLATE 5 MG PO TABS: 5.0000 mg | ORAL_TABLET | Freq: Every day | ORAL | 3 refills | Status: DC

## 2018-12-30 MED ORDER — NICOTINE 14 MG/24HR TD PT24: 14.0000 mg | MEDICATED_PATCH | Freq: Every day | TRANSDERMAL | 0 refills | Status: DC

## 2018-12-30 MED ORDER — EZETIMIBE 10 MG PO TABS: 10.0000 mg | ORAL_TABLET | Freq: Every day | ORAL | 3 refills | Status: DC

## 2018-12-30 MED ORDER — NICOTINE 21 MG/24HR TD PT24: 21.0000 mg | MEDICATED_PATCH | Freq: Every day | TRANSDERMAL | 0 refills | Status: DC

## 2018-12-30 NOTE — Patient Instructions (Signed)

## 2018-12-30 NOTE — Progress Notes (Signed)
Subjective: CC: HTN, HLD PCP: Janora Norlander, DO MWN:UUVO Bridget Gutierrez is a 48 y.o. female presenting to clinic today for:  1. Hypertension/ HLD/tobacco use disorder Patient reports compliance with her amlodipine 5 mg daily, hydrochlorothiazide 25 mg daily, Lipitor 40 mg daily and Zetia 10 mg daily.  She continues to smoke 1 pack/day.  She never got the Chantix because it was over $400.  This is unaffordable.  She is tried the gum in the past but cannot tolerate it.  She would be willing to try patches again.  Denies any hemoptysis, shortness of breath, wheeze, unplanned weight loss, chest pain, lower extremity edema or abdominal pain.  2. GAD/Depression Patient reports that symptoms are well controlled on Lexapro 10 mg.  Denies any depressive or anxiety symptoms.  3.  Burning in hands and feet Patient with longstanding history of bilateral numbness and tingling in feet.  She has intermittent numbness and tingling in her hands as well.  She has a known ganglion cyst of the right wrist.  She notes that the numbness and tingling in that hand tends to be exacerbated by overuse or when she wakes up in the morning.  She is very reluctant to see a surgeon to have this removed.  She does occasionally brace, more when things are bad not regularly.  She has not been on any medication for this prescribed including gabapentin.  ROS: Per HPI  No Known Allergies Past Medical History:  Diagnosis Date  . Anxiety   . B12 deficiency   . Depression   . Fatty liver   . Hyperlipidemia   . Hypertension   . Tubular adenoma of colon 2019    Current Outpatient Medications:  .  amLODipine (NORVASC) 5 MG tablet, Take 1 tablet (5 mg total) by mouth daily., Disp: 90 tablet, Rfl: 3 .  atorvastatin (LIPITOR) 40 MG tablet, Take 1 tablet (40 mg total) by mouth daily., Disp: 90 tablet, Rfl: 3 .  cyanocobalamin 500 MCG tablet, Take 1,000 mcg by mouth daily. , Disp: , Rfl:  .  escitalopram (LEXAPRO) 10 MG tablet,  Take 1 tablet (10 mg total) by mouth daily., Disp: 90 tablet, Rfl: 3 .  ezetimibe (ZETIA) 10 MG tablet, Take 1 tablet (10 mg total) by mouth daily., Disp: 90 tablet, Rfl: 3 .  hydrochlorothiazide (HYDRODIURIL) 25 MG tablet, Take 1 tablet (25 mg total) by mouth daily., Disp: 90 tablet, Rfl: 3 Social History   Socioeconomic History  . Marital status: Married    Spouse name: Not on file  . Number of children: Not on file  . Years of education: Not on file  . Highest education level: Not on file  Occupational History  . Not on file  Social Needs  . Financial resource strain: Not on file  . Food insecurity    Worry: Not on file    Inability: Not on file  . Transportation needs    Medical: Not on file    Non-medical: Not on file  Tobacco Use  . Smoking status: Current Every Day Smoker    Packs/day: 1.50    Years: 15.00    Pack years: 22.50  . Smokeless tobacco: Never Used  Substance and Sexual Activity  . Alcohol use: No    Frequency: Never    Comment: Former 28 cans beer/ week  . Drug use: No  . Sexual activity: Not Currently  Lifestyle  . Physical activity    Days per week: Not on file  Minutes per session: Not on file  . Stress: Not on file  Relationships  . Social Herbalist on phone: Not on file    Gets together: Not on file    Attends religious service: Not on file    Active member of club or organization: Not on file    Attends meetings of clubs or organizations: Not on file    Relationship status: Not on file  . Intimate partner violence    Fear of current or ex partner: Not on file    Emotionally abused: Not on file    Physically abused: Not on file    Forced sexual activity: Not on file  Other Topics Concern  . Not on file  Social History Narrative  . Not on file   Family History  Problem Relation Age of Onset  . COPD Mother   . Hypertension Mother   . Cancer Mother   . Hyperlipidemia Mother   . Cervical cancer Mother   . Heart disease  Mother   . Heart attack Mother   . Hypertension Father   . Cancer Sister   . Colon cancer Sister 60  . Testicular cancer Brother   . Anxiety disorder Daughter     Objective: Office vital signs reviewed. BP 120/82   Pulse 74   Temp (!) 97.4 F (36.3 C) (Oral)   Ht '5\' 4"'  (1.626 m)   Wt 183 lb 9.6 oz (83.3 kg)   BMI 31.51 kg/m   Physical Examination:  General: Awake, alert, well nourished, No acute distress HEENT: Normal, sclera white, MMM Cardio: regular rate and rhythm, S1S2 heard, no murmurs appreciated Pulm: clear to auscultation bilaterally, no wheezes, rhonchi or rales; normal work of breathing on room air Extremities: warm, well perfused, No edema, cyanosis or clubbing; +2 pulses bilaterally MSK: normal gait and normal station; she is a small ganglion cyst on the ventral aspect of the right wrist on the radial side. No change in size since last visit.   Psych: Mood stable, speech normal, affect appropriate, pleasant, interactive  Depression screen [facility] 2/9 12/30/2018 06/19/2018 12/17/2017  Decreased Interest 0 0 0  Down, Depressed, Hopeless 0 0 1  PHQ - 2 Score 0 0 1  Altered sleeping 1 0 -  Tired, decreased energy 1 0 -  Change in appetite 0 0 -  Feeling bad or failure about yourself  0 0 -  Trouble concentrating 0 0 -  Moving slowly or fidgety/restless 0 0 -  Suicidal thoughts 0 0 -  PHQ-9 Score 2 0 -  Difficult doing work/chores - Not difficult at all -    Assessment/ Plan: 48 y.o. female   1. Essential hypertension Controlled.  Refill sent. - CMP14+EGFR - amLODipine (NORVASC) 5 MG tablet; Take 1 tablet (5 mg total) by mouth daily.  Dispense: 90 tablet; Refill: 3 - hydrochlorothiazide (HYDRODIURIL) 25 MG tablet; Take 1 tablet (25 mg total) by mouth daily.  Dispense: 90 tablet; Refill: 3  2. Fatty liver disease, nonalcoholic Check fasting lipid panel. - Lipid Panel - CMP14+EGFR - atorvastatin (LIPITOR) 40 MG tablet; Take 1 tablet (40 mg total) by mouth  daily.  Dispense: 90 tablet; Refill: 3 - ezetimibe (ZETIA) 10 MG tablet; Take 1 tablet (10 mg total) by mouth daily.  Dispense: 90 tablet; Refill: 3  3. Anxiety and depression Controlled.  Continue Lexapro - escitalopram (LEXAPRO) 10 MG tablet; Take 1 tablet (10 mg total) by mouth daily.  Dispense: 90 tablet; Refill:  3  4. Tobacco use disorder Patient is in the action phase of smoking cessation.  Chantix was not affordable and therefore we will proceed with patches. - nicotine (NICODERM CQ - DOSED IN MG/24 HOURS) 21 mg/24hr patch; Place 1 patch (21 mg total) onto the skin daily.  Dispense: 28 patch; Refill: 0 - nicotine (NICODERM CQ - DOSED IN MG/24 HOURS) 14 mg/24hr patch; Place 1 patch (14 mg total) onto the skin daily.  Dispense: 28 patch; Refill: 0 - nicotine (NICODERM CQ - DOSED IN MG/24 HR) 7 mg/24hr patch; Place 1 patch (7 mg total) onto the skin daily.  Dispense: 28 patch; Refill: 0  5. Peripheral neuralgia Trial of gabapentin.  Caution sedation. - gabapentin (NEURONTIN) 300 MG capsule; Take 1 capsule (300 mg total) by mouth at bedtime for 7 days, THEN 1 capsule (300 mg total) 2 (two) times daily for 21 days.  Dispense: 60 capsule; Refill: 3  6. Perimenopausal 6 months with no menstrual cycle.   Orders Placed This Encounter  Procedures  . Lipid Panel  . CMP14+EGFR   Meds ordered this encounter  Medications  . gabapentin (NEURONTIN) 300 MG capsule    Sig: Take 1 capsule (300 mg total) by mouth at bedtime for 7 days, THEN 1 capsule (300 mg total) 2 (two) times daily for 21 days.    Dispense:  60 capsule    Refill:  3  . nicotine (NICODERM CQ - DOSED IN MG/24 HOURS) 21 mg/24hr patch    Sig: Place 1 patch (21 mg total) onto the skin daily.    Dispense:  28 patch    Refill:  0  . nicotine (NICODERM CQ - DOSED IN MG/24 HOURS) 14 mg/24hr patch    Sig: Place 1 patch (14 mg total) onto the skin daily.    Dispense:  28 patch    Refill:  0  . nicotine (NICODERM CQ - DOSED IN  MG/24 HR) 7 mg/24hr patch    Sig: Place 1 patch (7 mg total) onto the skin daily.    Dispense:  28 patch    Refill:  0  . amLODipine (NORVASC) 5 MG tablet    Sig: Take 1 tablet (5 mg total) by mouth daily.    Dispense:  90 tablet    Refill:  3  . atorvastatin (LIPITOR) 40 MG tablet    Sig: Take 1 tablet (40 mg total) by mouth daily.    Dispense:  90 tablet    Refill:  3  . escitalopram (LEXAPRO) 10 MG tablet    Sig: Take 1 tablet (10 mg total) by mouth daily.    Dispense:  90 tablet    Refill:  3  . ezetimibe (ZETIA) 10 MG tablet    Sig: Take 1 tablet (10 mg total) by mouth daily.    Dispense:  90 tablet    Refill:  3  . hydrochlorothiazide (HYDRODIURIL) 25 MG tablet    Sig: Take 1 tablet (25 mg total) by mouth daily.    Dispense:  90 tablet    Refill:  [place], [place] [phone]

## 2018-12-31 ENCOUNTER — Other Ambulatory Visit: Payer: Self-pay | Admitting: Family Medicine

## 2018-12-31 DIAGNOSIS — E876 Hypokalemia: Secondary | ICD-10-CM

## 2018-12-31 DIAGNOSIS — E782 Mixed hyperlipidemia: Secondary | ICD-10-CM

## 2018-12-31 MED ORDER — VASCEPA 1 G PO CAPS: 2.0000 g | ORAL_CAPSULE | Freq: Two times a day (BID) | ORAL | 3 refills | Status: DC

## 2018-12-31 MED ORDER — POTASSIUM CHLORIDE CRYS ER 20 MEQ PO TBCR: 20.0000 meq | EXTENDED_RELEASE_TABLET | Freq: Two times a day (BID) | ORAL | 0 refills | Status: DC

## 2019-01-01 ENCOUNTER — Telehealth: Payer: Self-pay | Admitting: *Deleted

## 2019-01-01 ENCOUNTER — Other Ambulatory Visit: Payer: Self-pay | Admitting: Family Medicine

## 2019-01-01 MED ORDER — OMEGA-3-ACID ETHYL ESTERS 1 G PO CAPS: 2.0000 g | ORAL_CAPSULE | Freq: Two times a day (BID) | ORAL | 3 refills | Status: DC

## 2019-01-01 NOTE — Telephone Encounter (Signed)
Pt aware of change in medications and results.

## 2019-01-01 NOTE — Telephone Encounter (Signed)
Replaced with lovaza

## 2019-01-01 NOTE — Telephone Encounter (Signed)
patient has to have  established cardiovascular disease (CVD) or has diabetes plus 2 additional risk factors for CVD and has tried OTC fish oil or lovaza

## 2019-01-02 ENCOUNTER — Telehealth: Payer: Self-pay | Admitting: Family Medicine

## 2019-01-02 NOTE — Telephone Encounter (Signed)
Yes, this was already done yesterday.  Please inform patient.

## 2019-05-01 ENCOUNTER — Other Ambulatory Visit: Payer: Self-pay | Admitting: Family Medicine

## 2019-05-01 DIAGNOSIS — M792 Neuralgia and neuritis, unspecified: Secondary | ICD-10-CM

## 2019-07-29 ENCOUNTER — Other Ambulatory Visit: Payer: Self-pay | Admitting: Family Medicine

## 2019-08-27 ENCOUNTER — Other Ambulatory Visit: Payer: Self-pay | Admitting: Family Medicine

## 2019-08-27 DIAGNOSIS — M792 Neuralgia and neuritis, unspecified: Secondary | ICD-10-CM

## 2019-09-09 ENCOUNTER — Telehealth (INDEPENDENT_AMBULATORY_CARE_PROVIDER_SITE_OTHER): Payer: Managed Care, Other (non HMO) | Admitting: Family Medicine

## 2019-09-09 ENCOUNTER — Encounter: Payer: Self-pay | Admitting: Family Medicine

## 2019-09-09 DIAGNOSIS — I10 Essential (primary) hypertension: Secondary | ICD-10-CM | POA: Diagnosis not present

## 2019-09-09 DIAGNOSIS — E538 Deficiency of other specified B group vitamins: Secondary | ICD-10-CM

## 2019-09-09 DIAGNOSIS — M792 Neuralgia and neuritis, unspecified: Secondary | ICD-10-CM

## 2019-09-09 DIAGNOSIS — F419 Anxiety disorder, unspecified: Secondary | ICD-10-CM

## 2019-09-09 DIAGNOSIS — H0263 Xanthelasma of right eye, unspecified eyelid: Secondary | ICD-10-CM | POA: Insufficient documentation

## 2019-09-09 DIAGNOSIS — F32A Depression, unspecified: Secondary | ICD-10-CM

## 2019-09-09 DIAGNOSIS — H0266 Xanthelasma of left eye, unspecified eyelid: Secondary | ICD-10-CM

## 2019-09-09 DIAGNOSIS — E782 Mixed hyperlipidemia: Secondary | ICD-10-CM

## 2019-09-09 DIAGNOSIS — F329 Major depressive disorder, single episode, unspecified: Secondary | ICD-10-CM

## 2019-09-09 DIAGNOSIS — E876 Hypokalemia: Secondary | ICD-10-CM

## 2019-09-09 MED ORDER — GABAPENTIN 300 MG PO CAPS: 300.0000 mg | ORAL_CAPSULE | Freq: Two times a day (BID) | ORAL | 3 refills | Status: DC

## 2019-09-09 MED ORDER — OMEGA-3-ACID ETHYL ESTERS 1 G PO CAPS: 2.0000 | ORAL_CAPSULE | Freq: Two times a day (BID) | ORAL | 3 refills | Status: DC

## 2019-09-09 NOTE — Progress Notes (Signed)
MyChart video visit  Subjective: CC: neuropathy, HLD PCP: Janora Norlander, DO Bridget Gutierrez is a 49 y.o. female calls for video consult today. Patient provides verbal consent for consult held via video.  Due to COVID-19 pandemic this visit was conducted virtually. This visit type was conducted due to national recommendations for restrictions regarding the COVID-19 Pandemic (e.g. social distancing, sheltering in place) in an effort to limit this patient's exposure and mitigate transmission in our community. All issues noted in this document were discussed and addressed.  A physical exam was not performed with this format.   Location of patient: home Location of provider: Working remotely from home Others present for call: none  1. HTN, HLD Patient reports compliance with Norvasc 30m, HCTZ 279mdaily.  No chest pain, shortness of breath, lower extremity edema.  She continues to smoke see below.  2. Neuropathy/ Vitamin B12 deficiency Notes that neuropathy has been present with full force since running out of her gabapentin.  She had been taking only 1/day recently even though it is prescribed twice daily because she was running out of medicine.  Her last vitamin B12 check was normal but she has had history of vitamin B12 deficiency in the past.  She notes some fatigue.  She is compliant with an oral OTC vitamin B12.  3. Tobacco use Continues to smoke 1 to 1/2 packs/day.  She did use the patches all 3 months but unfortunately was not really able to reduce how much she smokes.  She cites her husband as part of the reason why she smokes, citing that he causes anxiety and stress.  She also notes that the more that she thinks about stopping smoking that she smokes more as a result.  No hemoptysis, unplanned weight loss, night sweats   ROS: Per HPI  No Known Allergies Past Medical History:  Diagnosis Date  . Anxiety   . B12 deficiency   . Depression   . Fatty liver   . Hyperlipidemia    . Hypertension   . Tubular adenoma of colon 2019    Current Outpatient Medications:  .  amLODipine (NORVASC) 5 MG tablet, Take 1 tablet (5 mg total) by mouth daily., Disp: 90 tablet, Rfl: 3 .  atorvastatin (LIPITOR) 40 MG tablet, Take 1 tablet (40 mg total) by mouth daily., Disp: 90 tablet, Rfl: 3 .  cyanocobalamin 500 MCG tablet, Take 1,000 mcg by mouth daily. , Disp: , Rfl:  .  escitalopram (LEXAPRO) 10 MG tablet, Take 1 tablet (10 mg total) by mouth daily., Disp: 90 tablet, Rfl: 3 .  ezetimibe (ZETIA) 10 MG tablet, Take 1 tablet (10 mg total) by mouth daily., Disp: 90 tablet, Rfl: 3 .  gabapentin (NEURONTIN) 300 MG capsule, Take 1 capsule (300 mg total) by mouth 2 (two) times daily., Disp: 180 capsule, Rfl: 3 .  hydrochlorothiazide (HYDRODIURIL) 25 MG tablet, Take 1 tablet (25 mg total) by mouth daily., Disp: 90 tablet, Rfl: 3 .  omega-3 acid ethyl esters (LOVAZA) 1 g capsule, Take 2 capsules (2 g total) by mouth 2 (two) times daily., Disp: 360 capsule, Rfl: 3  Gen: Well-appearing female in no acute distress HEENT: Xanthelasmas noted on bilateral upper lids and lower lids Pulmonary: Actively smoking a cigarette during her visit today.  No coughing, shortness of breath, wheeze.  No dyspnea with speech Psych: Mood stable, speech normal, affect appropriate, pleasant and conversive   Assessment/ Plan: [age].o. female   1. Peripheral neuralgia Currently not controlled but  has been off of her medications.  Gabapentin renewed.  She will follow-up with me in June for full physical exam - gabapentin (NEURONTIN) 300 MG capsule; Take 1 capsule (300 mg total) by mouth 2 (two) times daily.  Dispense: 180 capsule; Refill: 3 - CBC; Future  2. Mixed hyperlipidemia She will come in for fasting lipid panel.  Recommend that she go back on her omega's for a few weeks prior to coming in for this lipid panel since she has been out of it for a couple of weeks. - CMP14+EGFR; Future - Lipid panel;  Future  3. Xanthelasma of eyelid, bilateral We discussed consideration for evaluation by dermatology versus plastic surgery as she is motivated to have these removed.  She will check into Dr. Nevada Crane since he is in network.  She will contact me if she needs a formal referral  4. Essential hypertension - CMP14+EGFR; Future  5. Hypokalemia Possibly the reason why she has been feeling a little bit more fatigued.  I will check her potassium level.  We will also plan to check B12 as below - CMP14+EGFR; Future - Magnesium; Future  6. Anxiety and depression Stable/improving.  7. B12 deficiency - Vitamin B12; Future   Start time: 8:30am End time: 9:00am  Total time spent on patient care (including telephone call/ virtual visit): 37 minutes  [place], [place] [phone]

## 2019-09-26 IMAGING — US US ABDOMEN LIMITED
1 series · 14 of 25 positions shown · non-contrast
Comparison: None.

CLINICAL DATA: Abnormal liver function tests, ETOH abuse

EXAM:
ULTRASOUND ABDOMEN LIMITED RIGHT UPPER QUADRANT

[Series 1: us abdomen limited · 0.21mm/px · 14 of 64 slices shown]
[im 1/64]
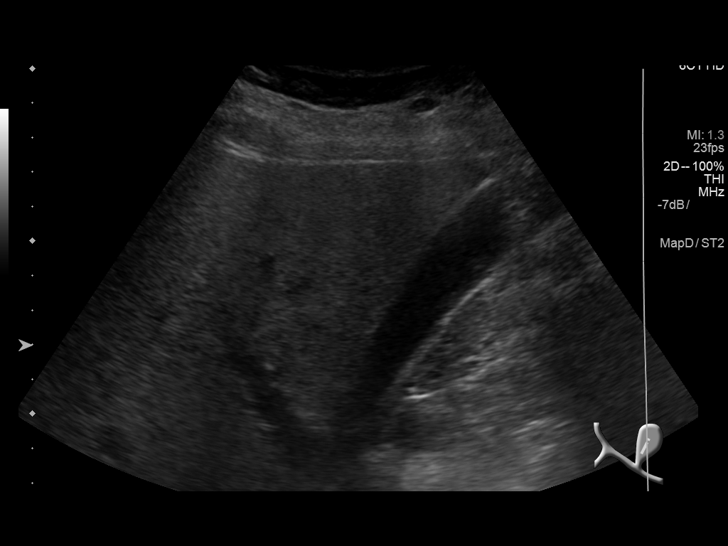
[im 6/64]
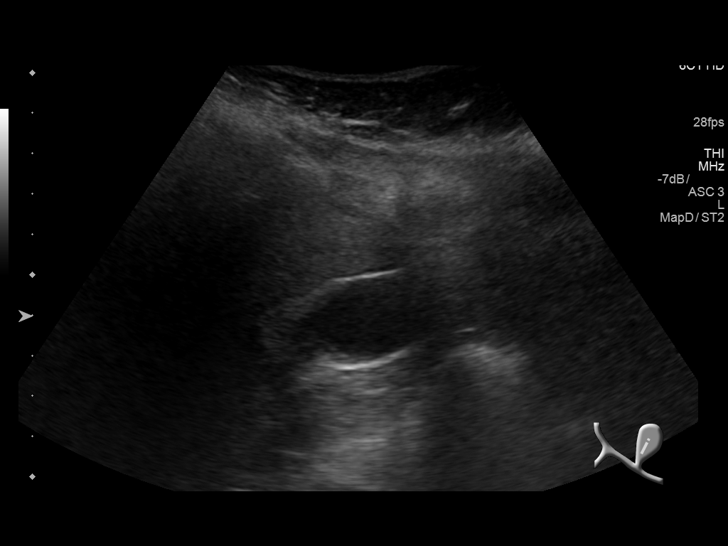
[im 11/64]
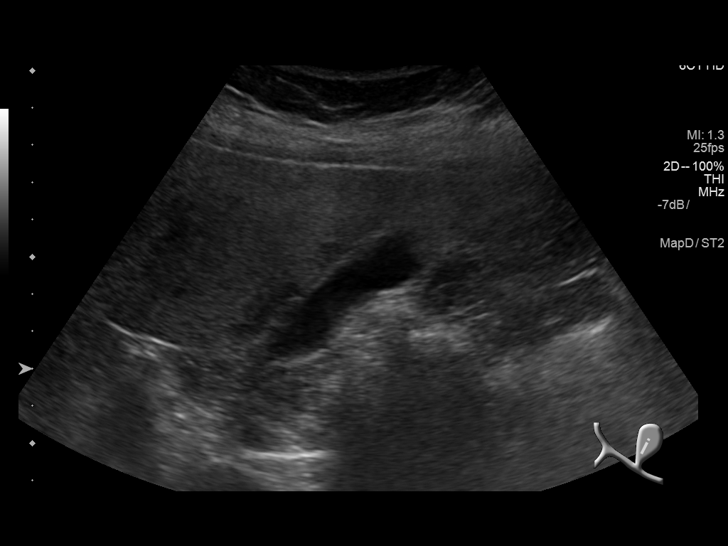
[im 16/64]
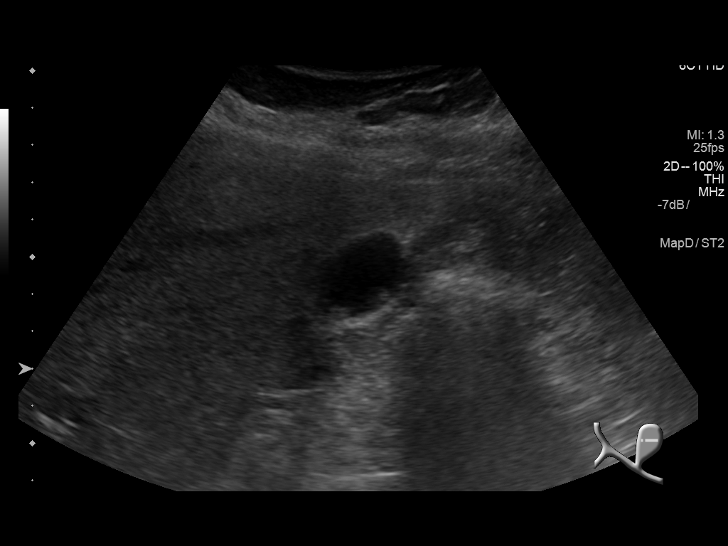
[im 22/64]
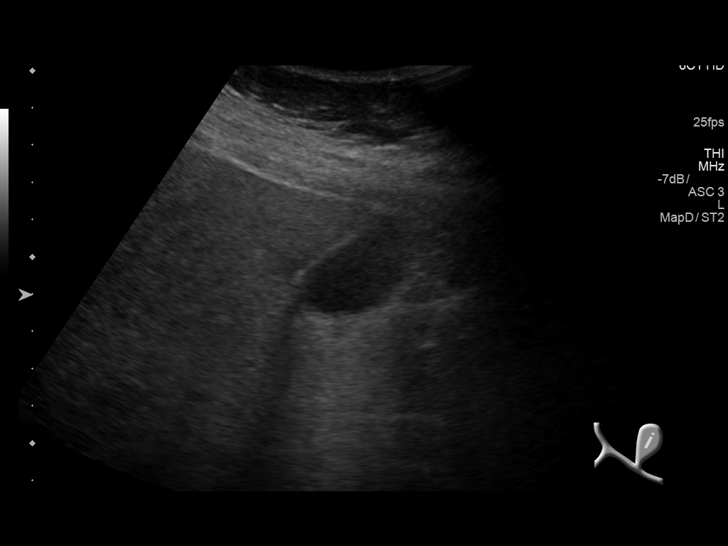
[im 24/64]
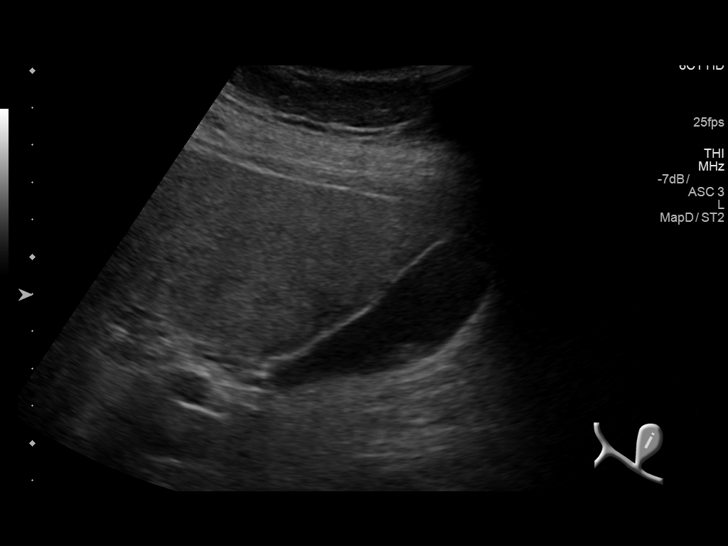
[im 29/64]
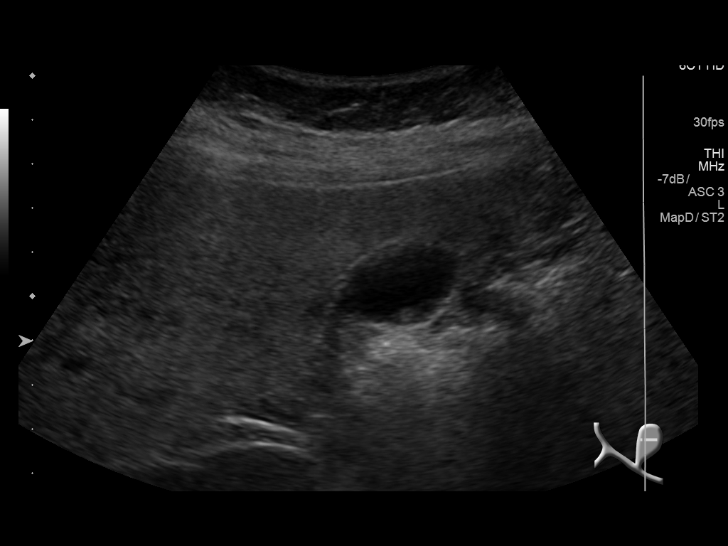
[im 35/64]
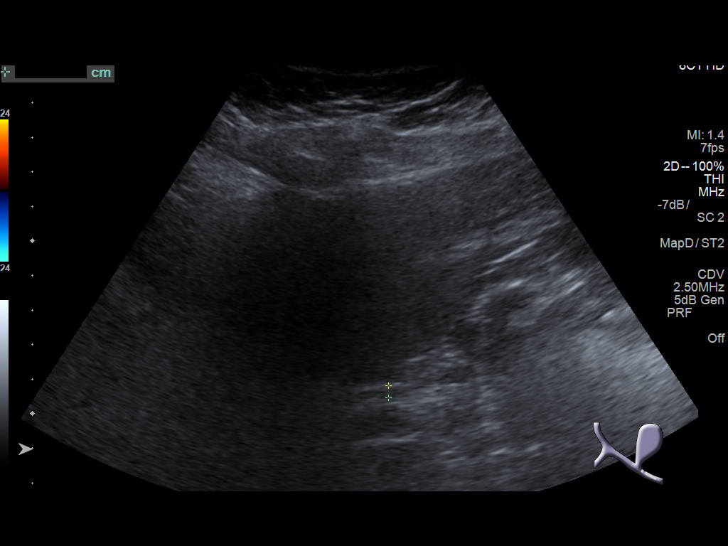
[im 40/64]
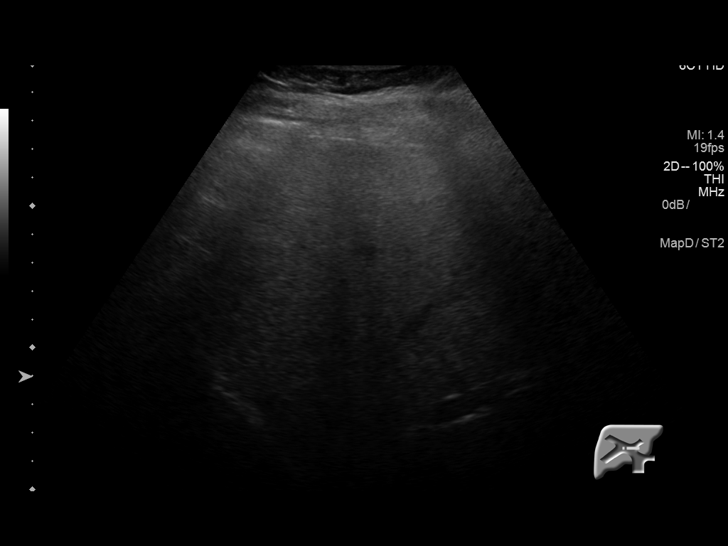
[im 43/64]
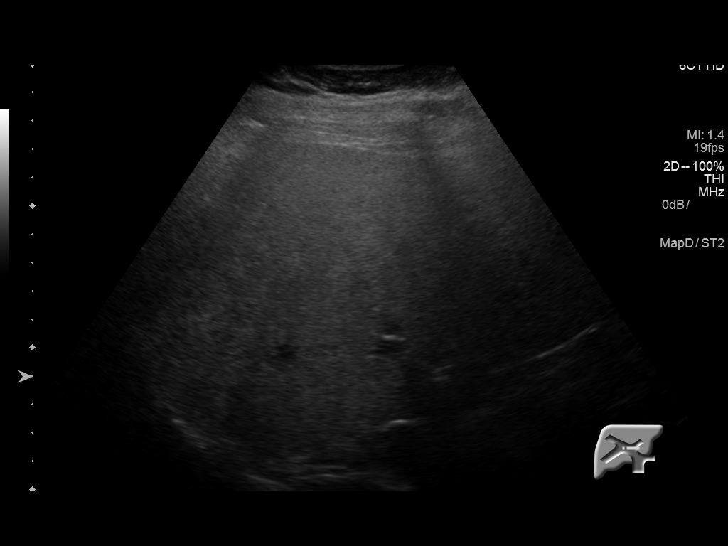
[im 48/64]
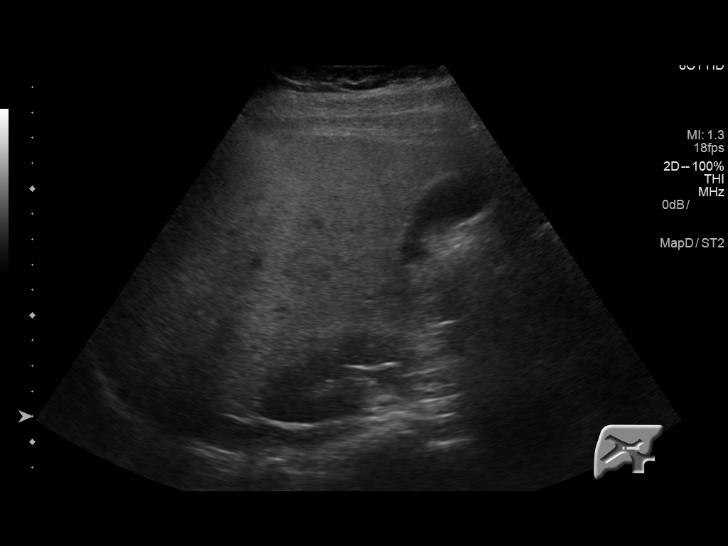
[im 53/64]
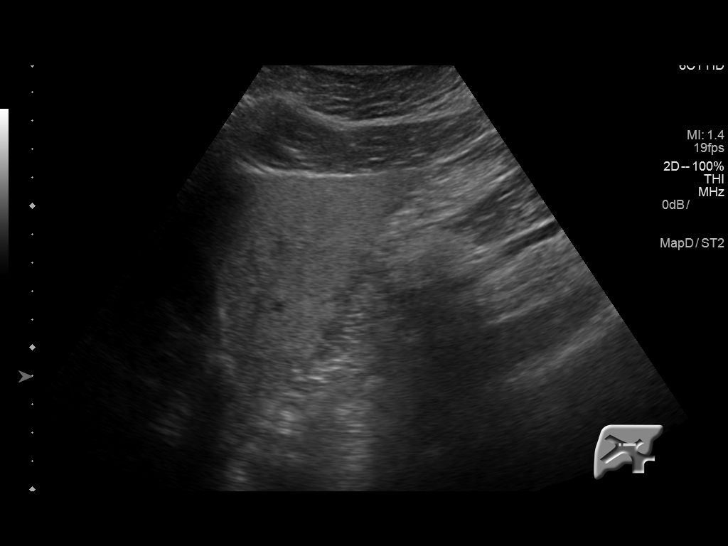
[im 58/64]
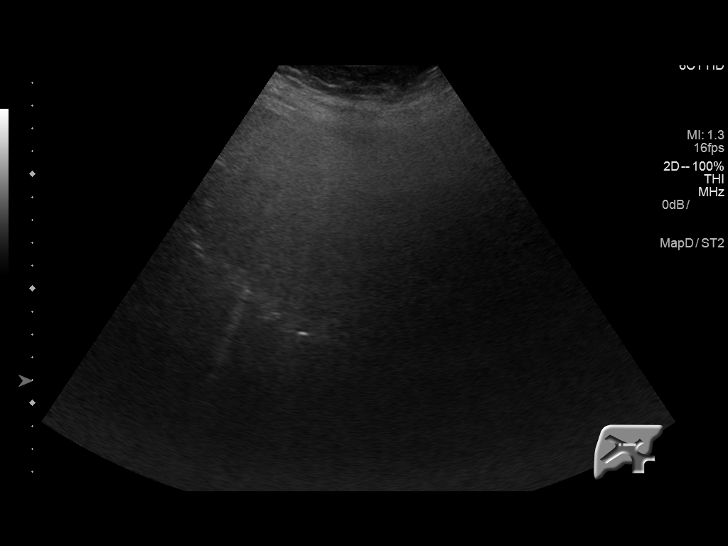
[im 64/64]
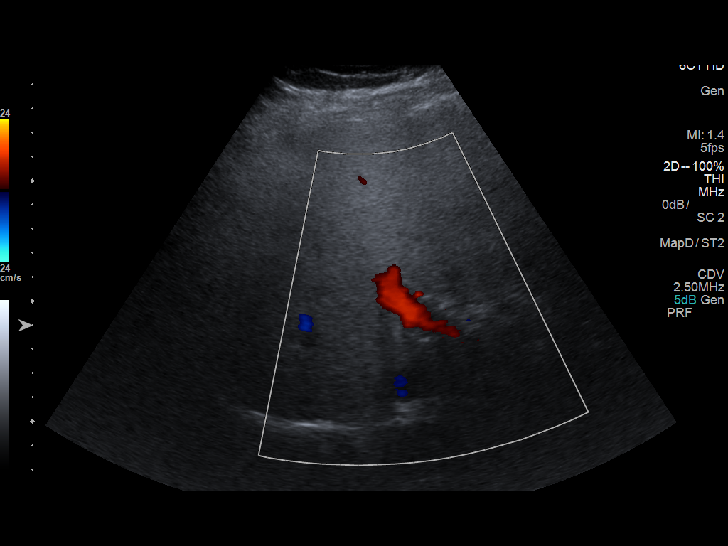

[14 of 25 positions shown; findings below may reference images not displayed]

FINDINGS: Gallbladder:

There is echodensity within the gallbladder some of which represents
gallbladder sludge. However there may be a small gallstone of
approximately 5 mm in diameter present. There is no pain present
over the gallbladder with compression.

Common bile duct:

Diameter: The common bile duct is normal measuring 3.5 mm in
diameter.

Liver:

The parenchyma of the liver is echogenic and inhomogeneous
consistent with fatty infiltration. No focal hepatic abnormality is
seen. Portal vein is patent on color Doppler imaging with normal
direction of blood flow towards the liver.
IMPRESSION: 1. Inhomogeneous echogenic liver parenchyma consistent with diffuse
fatty infiltration. No focal abnormality.
2. Gallbladder sludge and probable small gallstone of 5 mm in
diameter. No gallbladder wall thickening or pain over the
gallbladder.

## 2019-09-30 ENCOUNTER — Other Ambulatory Visit: Payer: Managed Care, Other (non HMO)

## 2019-09-30 ENCOUNTER — Other Ambulatory Visit: Payer: Self-pay

## 2019-09-30 DIAGNOSIS — E876 Hypokalemia: Secondary | ICD-10-CM

## 2019-09-30 DIAGNOSIS — E782 Mixed hyperlipidemia: Secondary | ICD-10-CM

## 2019-09-30 DIAGNOSIS — I10 Essential (primary) hypertension: Secondary | ICD-10-CM

## 2019-09-30 DIAGNOSIS — E538 Deficiency of other specified B group vitamins: Secondary | ICD-10-CM

## 2019-09-30 DIAGNOSIS — M792 Neuralgia and neuritis, unspecified: Secondary | ICD-10-CM

## 2019-10-01 LAB — CMP14+EGFR
ALT: 53 IU/L — ABNORMAL HIGH (ref 0–32)
AST: 42 IU/L — ABNORMAL HIGH (ref 0–40)
Albumin/Globulin Ratio: 1.7 (ref 1.2–2.2)
Albumin: 4.7 g/dL (ref 3.8–4.8)
Alkaline Phosphatase: 124 IU/L — ABNORMAL HIGH (ref 39–117)
BUN/Creatinine Ratio: 12 (ref 9–23)
BUN: 8 mg/dL (ref 6–24)
Bilirubin Total: 0.4 mg/dL (ref 0.0–1.2)
CO2: 25 mmol/L (ref 20–29)
Calcium: 9.9 mg/dL (ref 8.7–10.2)
Chloride: 97 mmol/L (ref 96–106)
Creatinine, Ser: 0.69 mg/dL (ref 0.57–1.00)
GFR calc Af Amer: 119 mL/min/{1.73_m2} (ref >59)
GFR calc non Af Amer: 103 mL/min/{1.73_m2} (ref >59)
Globulin, Total: 2.7 g/dL (ref 1.5–4.5)
Glucose: 103 mg/dL — ABNORMAL HIGH (ref 65–99)
Potassium: 3.5 mmol/L (ref 3.5–5.2)
Sodium: 140 mmol/L (ref 134–144)
Total Protein: 7.4 g/dL (ref 6.0–8.5)

## 2019-10-01 LAB — LIPID PANEL
Chol/HDL Ratio: 9.7 ratio — ABNORMAL HIGH (ref 0.0–4.4)
Cholesterol, Total: 292 mg/dL — ABNORMAL HIGH (ref 100–199)
HDL: 30 mg/dL — ABNORMAL LOW (ref >39)
LDL Chol Calc (NIH): 229 mg/dL — ABNORMAL HIGH (ref 0–99)
Triglycerides: 171 mg/dL — ABNORMAL HIGH (ref 0–149)
VLDL Cholesterol Cal: 33 mg/dL (ref 5–40)

## 2019-10-01 LAB — CBC
Hematocrit: 44.1 % (ref 34.0–46.6)
Hemoglobin: 15.4 g/dL (ref 11.1–15.9)
MCH: 32.8 pg (ref 26.6–33.0)
MCHC: 34.9 g/dL (ref 31.5–35.7)
MCV: 94 fL (ref 79–97)
Platelets: 430 10*3/uL (ref 150–450)
RBC: 4.7 x10E6/uL (ref 3.77–5.28)
RDW: 13.7 % (ref 11.7–15.4)
WBC: 12.3 10*3/uL — ABNORMAL HIGH (ref 3.4–10.8)

## 2019-10-01 LAB — VITAMIN B12: Vitamin B-12: 1731 pg/mL — ABNORMAL HIGH (ref 232–1245)

## 2019-10-01 LAB — MAGNESIUM: Magnesium: 2.1 mg/dL (ref 1.6–2.3)

## 2019-10-02 ENCOUNTER — Telehealth: Payer: Self-pay | Admitting: Pharmacist

## 2019-10-02 NOTE — Telephone Encounter (Signed)
   Successful telephone outreach to patient with HIPAA identifiers verified.  The goal of this call today is to discuss patient's cholesterol.   Current Barriers:  . Uncontrolled hyperlipidemia, complicated by familial hyperlipidemia, hypertension . Current antihyperlipidemic regimen: o Lovaza 2 capsules (2 g) ORALLY twice daily o Atorvastatin 40mg  by mouth qHS o Ezetimibe 10mg  by mouth daily . Previous antihyperlipidemic medications tried:  n/a . Patient reports complete 100% with medications.  She states she knows her cholesterol is high, however this panel was "good for her" . Most recent lipid panel     Component Value Date/Time   CHOL 292 (H) 09/30/2019 0903   TRIG 171 (H) 09/30/2019 0903   HDL 30 (L) 09/30/2019 0903   CHOLHDL 9.7 (H) 09/30/2019 0903   LDLCALC 229 (H) 09/30/2019 0903   LABVLDL 33 09/30/2019 0903   . ASCVD risk enhancing conditions: age >65, DM, HTN, CKD, CHF, current smoker The 10-year ASCVD risk score Mikey Bussing DC Jr., et al., 2013) is: 14.4%   Values used to calculate the score:     Age: 49 years     Sex: Female     Is Non-Hispanic African American: No     Diabetic: No     Tobacco smoker: Yes     Systolic Blood Pressure: 123456 mmHg     Is BP treated: Yes     HDL Cholesterol: 30 mg/dL .     Total Cholesterol: 292 mg/dL  Pharmacist Clinical Goal(s):  [name] Patient will work with PharmD and providers towards optimized antihyperlipidemic therapy and smoking cessation  Interventions: . Comprehensive medication review performed; medication list updated in electronic medical record.  . Discussed diet/exercise/smoking cessation o She is currently on a cabbage soup diet o She does enjoy cooking with butter/margarine/mayonaise . Discussed potentially adding PCSK9 (Repatha) for better control of lipids; will explore insurance coverage with [employer]; $5 copay card available if covered . Will call patient and schedule face to face for next week (week of 10/06/19)  Patient  Self Care Activities:  . Patient will focus on medication/lifestyle adherence    Regina Eck, PharmD, BCPS Clinical Pharmacist, [place]  II Phone [phone]

## 2019-10-07 ENCOUNTER — Telehealth: Payer: Self-pay | Admitting: Pharmacist

## 2019-10-08 ENCOUNTER — Encounter: Payer: Self-pay | Admitting: Pharmacist

## 2019-10-08 ENCOUNTER — Ambulatory Visit (INDEPENDENT_AMBULATORY_CARE_PROVIDER_SITE_OTHER): Payer: Managed Care, Other (non HMO) | Admitting: Pharmacist

## 2019-10-08 ENCOUNTER — Other Ambulatory Visit: Payer: Self-pay

## 2019-10-08 VITALS — BP 132/81 | HR 85

## 2019-10-08 DIAGNOSIS — E782 Mixed hyperlipidemia: Secondary | ICD-10-CM | POA: Diagnosis not present

## 2019-10-08 MED ORDER — NEXLIZET 180-10 MG PO TABS: 1.0000 | ORAL_TABLET | Freq: Every day | ORAL | 0 refills | Status: DC

## 2019-10-08 NOTE — Telephone Encounter (Signed)
  Chronic Care Management   Outreach Note  10/07/2019 Name: Bridget Gutierrez MRN: [id number] DOB: [birth year]  Appointment scheduled for lipid clinic on 10/08/19 at 9am    Regina Eck, PharmD, BCPS Clinical Pharmacist, [place]  II Phone [phone]

## 2019-10-08 NOTE — Addendum Note (Signed)
Addended by: Janora Norlander on: 10/08/2019 05:31 PM   Modules accepted: Orders

## 2019-10-08 NOTE — Progress Notes (Signed)
10/08/2019 Name: Bridget Gutierrez MRN: [id number] DOB: 24-Sep-1970  Referred by: Janora Norlander, DO Reason for referral : Hyperlipidemia and Medication Management  HPI:  Bridget Gutierrez is a 49 y.o. female patient referred to lipid clinic by PCP-Dr. Ronnie Doss. PMH is significant for: familial hyperlipidemia Baruch Merl criteria calculated for FH=definite/positive, Xanthomas present around eyelids), hypertension, anxiety/depression, B12 deficiency.   Current Barriers:  . Uncontrolled hyperlipidemia, complicated by familial hyperlipidemia, HTN . Current antihyperlipidemic regimen: atorvastatin 40mg  qHS, Lovaza 2 caps BID, ezetimibe 10mg  daily . Previous antihyperlipidemic medications tried: n/a (no intolerances noted) . ASCVD risk enhancing conditions: age >33, DM, HTN, CKD, CHF, current smoker 10-year ASCVD risk score: The 10-year ASCVD risk score Bridget Gutierrez DC Bridget Gutierrez., et al., 2013) is: 17.2%  Values used to calculate the score:  Age: 78 years  Sex: Female  Is Non-Hispanic African American: No  Diabetic: No  Tobacco smoker: Yes  Systolic Blood Pressure: Q000111Q mmHg  Is BP treated: Yes  HDL Cholesterol: 30 mg/dL  Total Cholesterol: 292 mg/dL  Current Medications/Allergies: Current Outpatient Medications on File Prior to Visit  Medication Sig Dispense Refill  . amLODipine (NORVASC) 5 MG tablet Take 1 tablet (5 mg total) by mouth daily. 90 tablet 3  . atorvastatin (LIPITOR) 40 MG tablet Take 1 tablet (40 mg total) by mouth daily. 90 tablet 3  . Bempedoic Acid-Ezetimibe (NEXLIZET) 180-10 MG TABS Take 1 tablet by mouth daily.    . cyanocobalamin 500 MCG tablet Take 500 mcg by mouth daily.     [name] escitalopram (LEXAPRO) 10 MG tablet Take 1 tablet (10 mg total) by mouth daily. 90 tablet 3  . gabapentin (NEURONTIN) 300 MG capsule Take 1 capsule (300 mg total) by mouth 2 (two) times daily. 180 capsule 3  . hydrochlorothiazide (HYDRODIURIL) 25 MG tablet Take 1 tablet (25 mg total) by  mouth daily. 90 tablet 3  . omega-3 acid ethyl esters (LOVAZA) 1 g capsule Take 2 capsules (2 g total) by mouth 2 (two) times daily. 360 capsule 3   No Known Allergies   Diet: -Discussed diet with patient -Enjoys [place] cooking/food -Admits to not following cholesterol friendly diet as she should -Recently completed cabbage soup diet -Recommend to increase fiber intake via vegetables (for women recommend 21-25 grams of fiber daily)    Exercise: encouraged patient to walk/exercise as able   Family History: Family History  Problem Relation Age of Onset  . COPD Mother   . Hypertension Mother   . Cancer Mother   . Hyperlipidemia Mother   . Cervical cancer Mother   . Heart disease Mother   . Heart attack Mother   . Hypertension Father   . Cancer Sister   . Colon cancer Sister 19  . Testicular cancer Brother   . Anxiety disorder Daughter    Social History   Tobacco Use  . Smoking status: Current Every Day Smoker    Packs/day: 1.50    Years: 15.00    Pack years: 22.50  . Smokeless tobacco: Never Used  Substance and Sexual Activity  . Alcohol use: No    Comment: Former 28 cans beer/ week  . Drug use: No    Labs:   Lipid Panel     Component Value Date/Time   CHOL 292 (H) 09/30/2019 0903   TRIG 171 (H) 09/30/2019 0903   HDL 30 (L) 09/30/2019 0903   CHOLHDL 9.7 (H) 09/30/2019 0903   LDLCALC 229 (H) 09/30/2019 0903   LABVLDL 33  09/30/2019 0903       Assessment/Plan:   1. Hyperlipidemia   - continue atorvastatin 40mg  qHS, Lovaza 2 caps BID  - add Nexlizet (bempedoic acid/ezetimibe 180/10mg ) daily   -3 weeks of Nexlizet samples given --> LOT [id number], exp 3/22   -will have PCP call in 90-day prescription   -$10 copay card for 90-day RX given to patient   - discussed PCSK9 )Repatha), however patient not keen on injectable, will continue to explore and discuss the risk/benefits--> compromised on Nexlizet based on pill    burden, outcomes and low side effect  profile  - encouraged diet/lifestyle   - f/u PCP appt/labs scheduled for 3 months    Regina Eck, PharmD, BCPS Clinical Pharmacist, [place]  II Phone [phone]

## 2019-10-09 ENCOUNTER — Telehealth: Payer: Self-pay | Admitting: *Deleted

## 2019-10-09 NOTE — Telephone Encounter (Signed)
Incoming fax from pharmacy states that NEXLIZET 180-10 mg tab is NOT COVERED by pt plan.  They recommend we try one of the following:  EZETIMIBE ( PA not required) NEXLETOL ( PA required)   Please address your thoughts.

## 2019-10-10 NOTE — Telephone Encounter (Signed)
Patient aware.

## 2019-10-10 NOTE — Telephone Encounter (Signed)
Pt has coupon for free sample

## 2019-12-08 ENCOUNTER — Encounter: Payer: Self-pay | Admitting: Family

## 2019-12-08 ENCOUNTER — Ambulatory Visit: Payer: Managed Care, Other (non HMO) | Admitting: Family

## 2019-12-08 ENCOUNTER — Other Ambulatory Visit: Payer: Self-pay

## 2019-12-08 VITALS — BP 133/81 | HR 71 | Temp 98.0°F | Ht 64.0 in | Wt 193.8 lb

## 2019-12-08 DIAGNOSIS — Q383 Other congenital malformations of tongue: Secondary | ICD-10-CM

## 2019-12-08 DIAGNOSIS — F17200 Nicotine dependence, unspecified, uncomplicated: Secondary | ICD-10-CM

## 2019-12-08 DIAGNOSIS — J301 Allergic rhinitis due to pollen: Secondary | ICD-10-CM | POA: Diagnosis not present

## 2019-12-08 MED ORDER — CETIRIZINE HCL 10 MG PO TABS: 10.0000 mg | ORAL_TABLET | Freq: Every day | ORAL | 11 refills | Status: DC

## 2019-12-08 NOTE — Progress Notes (Signed)
   Subjective:    Patient ID: Bridget Gutierrez, female    DOB: 24-Feb-1971, 49 y.o.   MRN: [id number]  Chief Complaint  Patient presents with  . knot on back of tongue    Patient states she noticed it x 4 days ago.    HPI Pt presents to the office today with complaints of a "growth" on her left side of her tongue. She is a smoker and smokes a pack a day.   Denies any injury to her tongue, pain, or discharge.     Review of Systems  All other systems reviewed and are negative.      Objective:   Physical Exam Vitals reviewed.  Constitutional:      General: She is not in acute distress.    Appearance: She is well-developed.  HENT:     Head: Normocephalic and atraumatic.     Right Ear: Tympanic membrane normal.     Left Ear: Tympanic membrane normal.     Mouth/Throat:     Pharynx: Posterior oropharyngeal erythema present.  Eyes:     Pupils: Pupils are equal, round, and reactive to light.  Neck:     Thyroid: No thyromegaly.  Cardiovascular:     Rate and Rhythm: Normal rate and regular rhythm.     Heart sounds: Normal heart sounds. No murmur.  Pulmonary:     Effort: Pulmonary effort is normal. No respiratory distress.     Breath sounds: Normal breath sounds. No wheezing.  Abdominal:     General: Bowel sounds are normal. There is no distension.     Palpations: Abdomen is soft.     Tenderness: There is no abdominal tenderness.  Musculoskeletal:        General: No tenderness. Normal range of motion.     Cervical back: Normal range of motion and neck supple.  Skin:    General: Skin is warm and dry.  Neurological:     Mental Status: She is alert and oriented to person, place, and time.     Cranial Nerves: No cranial nerve deficit.     Deep Tendon Reflexes: Reflexes are normal and symmetric.  Psychiatric:        Behavior: Behavior normal.        Thought Content: Thought content normal.        Judgment: Judgment normal.     BP 133/81   Pulse 71   Temp 98 F (36.7 C)  (Temporal)   Ht 5\' 4"  (1.626 m)   Wt 193 lb 12.8 oz (87.9 kg)   LMP 09/17/2017 (Approximate)   SpO2 96%   BMI 33.27 kg/m        Assessment & Plan:  TRAVIS PURK comes in today with chief complaint of knot on back of tongue (Patient states she noticed it x 4 days ago.)   Diagnosis and orders addressed:  1. Allergic rhinitis due to pollen, unspecified seasonality Start zyrtec daily - Ambulatory referral to ENT - cetirizine (ZYRTEC) 10 MG tablet; Take 1 tablet (10 mg total) by mouth daily.  Dispense: 30 tablet; Refill: 11  2. Smoker Smoking cessation discussed  - Ambulatory referral to ENT  3. Tongue anomaly I do not feel anything, but given she is a smoker and states she feels something  Different, I will send to ENT.  - Ambulatory referral to ENT     Evelina Dun, FNP

## 2019-12-08 NOTE — Patient Instructions (Signed)

## 2020-01-02 ENCOUNTER — Telehealth: Payer: Self-pay | Admitting: Pharmacist

## 2020-01-02 ENCOUNTER — Telehealth: Payer: Managed Care, Other (non HMO) | Admitting: Pharmacist

## 2020-01-02 DIAGNOSIS — E782 Mixed hyperlipidemia: Secondary | ICD-10-CM

## 2020-01-02 MED ORDER — NEXLIZET 180-10 MG PO TABS: 1.0000 | ORAL_TABLET | Freq: Every day | ORAL | 2 refills | Status: DC

## 2020-01-02 NOTE — Telephone Encounter (Signed)
Patient doing well on Nexlizet--continue current therapy Recheck labs next week Denies side effects, etc Drowsy on Zyrtec (rec taking it at night or switching to Claritin)

## 2020-01-07 ENCOUNTER — Other Ambulatory Visit: Payer: Self-pay

## 2020-01-07 ENCOUNTER — Ambulatory Visit: Payer: Managed Care, Other (non HMO) | Admitting: Family Medicine

## 2020-01-07 ENCOUNTER — Encounter: Payer: Self-pay | Admitting: Family Medicine

## 2020-01-07 VITALS — BP 127/86 | HR 72 | Temp 97.8°F | Ht 64.0 in | Wt 192.2 lb

## 2020-01-07 DIAGNOSIS — E782 Mixed hyperlipidemia: Secondary | ICD-10-CM | POA: Diagnosis not present

## 2020-01-07 DIAGNOSIS — K148 Other diseases of tongue: Secondary | ICD-10-CM

## 2020-01-07 DIAGNOSIS — E538 Deficiency of other specified B group vitamins: Secondary | ICD-10-CM | POA: Diagnosis not present

## 2020-01-07 DIAGNOSIS — D72829 Elevated white blood cell count, unspecified: Secondary | ICD-10-CM | POA: Diagnosis not present

## 2020-01-07 DIAGNOSIS — I10 Essential (primary) hypertension: Secondary | ICD-10-CM | POA: Diagnosis not present

## 2020-01-07 DIAGNOSIS — F17200 Nicotine dependence, unspecified, uncomplicated: Secondary | ICD-10-CM

## 2020-01-07 NOTE — Patient Instructions (Signed)
COME IN FASTING for labs.  ONLY WATER allowed.  Message me about the ENT that is in network and I will place a new referral.

## 2020-01-07 NOTE — Progress Notes (Addendum)
Subjective: CC: Follow-up hyperlipidemia PCP: Bridget Norlander, DO JQG:BEEF Bridget Gutierrez is a 49 y.o. female presenting to clinic today for:  1.  Hyperlipidemia Patient reports compliance with low vase a, atorvastatin and newly, Nexlizet.  Denies any myalgia, intolerance to new medication.  No chest pain or shortness of breath.  She continues to smoke.  She is nonfasting today  2.  Tongue lesion/leukocytosis Patient reports a left-sided posterior tongue lesion that she noted about a month ago.  She was referred to ENT but unfortunately the ENT she is referred to does not accept her insurance and so she did not get seen.  She has been having a leukocytosis which she thought was due to her dental issues but notes that her dental issues are now under control.  Again she is an everyday smoker.  3.  B12 deficiency Patient reduced her B12 tablet to 1 tablet daily for B12 above normal range.  Denies any sensory changes since that reduction.   ROS: Per HPI  No Known Allergies Past Medical History:  Diagnosis Date  . Anxiety   . B12 deficiency   . Depression   . Fatty liver   . Hyperlipidemia   . Hypertension   . Tubular adenoma of colon 2019    Current Outpatient Medications:  .  amLODipine (NORVASC) 5 MG tablet, Take 1 tablet (5 mg total) by mouth daily., Disp: 90 tablet, Rfl: 3 .  atorvastatin (LIPITOR) 40 MG tablet, Take 1 tablet (40 mg total) by mouth daily., Disp: 90 tablet, Rfl: 3 .  Bempedoic Acid-Ezetimibe (NEXLIZET) 180-10 MG TABS, Take 1 tablet by mouth daily., Disp: 90 tablet, Rfl: 2 .  cetirizine (ZYRTEC) 10 MG tablet, Take 1 tablet (10 mg total) by mouth daily., Disp: 30 tablet, Rfl: 11 .  cyanocobalamin 500 MCG tablet, Take 500 mcg by mouth daily. , Disp: , Rfl:  .  escitalopram (LEXAPRO) 10 MG tablet, Take 1 tablet (10 mg total) by mouth daily., Disp: 90 tablet, Rfl: 3 .  gabapentin (NEURONTIN) 300 MG capsule, Take 1 capsule (300 mg total) by mouth 2 (two) times daily.,  Disp: 180 capsule, Rfl: 3 .  hydrochlorothiazide (HYDRODIURIL) 25 MG tablet, Take 1 tablet (25 mg total) by mouth daily., Disp: 90 tablet, Rfl: 3 .  omega-3 acid ethyl esters (LOVAZA) 1 g capsule, Take 2 capsules (2 g total) by mouth 2 (two) times daily., Disp: 360 capsule, Rfl: 3 Social History   Socioeconomic History  . Marital status: Married    Spouse name: Not on file  . Number of children: Not on file  . Years of education: Not on file  . Highest education level: Not on file  Occupational History  . Not on file  Tobacco Use  . Smoking status: Current Every Day Smoker    Packs/day: 1.50    Years: 15.00    Pack years: 22.50  . Smokeless tobacco: Never Used  Vaping Use  . Vaping Use: Never used  Substance and Sexual Activity  . Alcohol use: No    Comment: Former 28 cans beer/ week  . Drug use: No  . Sexual activity: Not Currently  Other Topics Concern  . Not on file  Social History Narrative  . Not on file   Social Determinants of Health   Financial Resource Strain:   . Difficulty of Paying Living Expenses:   Food Insecurity:   . Worried About Charity fundraiser in the Last Year:   . [employer] of  Food in the Last Year:   Transportation Needs:   . Film/video editor (Medical):   [name] Lack of Transportation (Non-Medical):   Physical Activity:   . Days of Exercise per Week:   . Minutes of Exercise per Session:   Stress:   . Feeling of Stress :   Social Connections:   . Frequency of Communication with Friends and Family:   . Frequency of Social Gatherings with Friends and Family:   . Attends Religious Services:   . Active Member of Clubs or Organizations:   . Attends Archivist Meetings:   [name] Marital Status:   Intimate Partner Violence:   . Fear of Current or Ex-Partner:   . Emotionally Abused:   [name] Physically Abused:   . Sexually Abused:    Family History  Problem Relation Age of Onset  . COPD Mother   . Hypertension Mother   . Cancer Mother   .  Hyperlipidemia Mother   . Cervical cancer Mother   . Heart disease Mother   . Heart attack Mother   . Hypertension Father   . Cancer Sister   . Colon cancer Sister 30  . Testicular cancer Brother   . Anxiety disorder Daughter     Objective: Office vital signs reviewed. BP 127/86   Pulse 72   Temp 97.8 F (36.6 C)   Ht '5\' 4"'  (1.626 m)   Wt 192 lb 3.2 oz (87.2 kg)   LMP 09/17/2017 (Approximate)   SpO2 98%   BMI 32.99 kg/m   Physical Examination:  General: Awake, alert, well nourished, No acute distress HEENT: Normal; sclera white.  Xanthelasma noted on bilateral eyelids; no appreciable oropharyngeal masses but exam is limited as I cannot see very far in the back of her throat Cardio: regular rate and rhythm, S1S2 heard, no murmurs appreciated Pulm: clear to auscultation bilaterally, no wheezes, rhonchi or rales; normal work of breathing on room air  Assessment/ Plan: 49 y.o. female   1. Mixed hyperlipidemia She will come in for fasting lipid tomorrow. Patient has Familial hypercholesterolemia - heterozygous;  Adjunct as defined by simon-broome criteria  - Lipid panel; Future  2. Essential hypertension Controlled - CMP14+EGFR; Future  3. B12 deficiency Recheck B12 - Vitamin B12; Future - CBC with Differential/Platelet; Future  4. Leukocytosis, unspecified type Uncertain etiology.  I am somewhat concerned given newly found tongue lesion and current tobacco use.  Repeat CBC with differential - CBC with Differential/Platelet; Future  5. Tongue lesion Plan for referral to ENT within her network.  She will contact me as to what Dr. To send her to  6. Tobacco use disorder Counseling.  Potentially contributing to the above   Orders Placed This Encounter  Procedures  . CBC with Differential/Platelet  . Lipid panel  . CMP14+EGFR  . Vitamin B12   No orders of the defined types were placed in this encounter.    Bridget Norlander, DO [place] [phone]

## 2020-01-12 ENCOUNTER — Other Ambulatory Visit: Payer: Self-pay | Admitting: Family Medicine

## 2020-01-12 ENCOUNTER — Other Ambulatory Visit: Payer: Self-pay

## 2020-01-12 ENCOUNTER — Other Ambulatory Visit: Payer: Managed Care, Other (non HMO)

## 2020-01-12 ENCOUNTER — Encounter: Payer: Self-pay | Admitting: Family Medicine

## 2020-01-12 DIAGNOSIS — I10 Essential (primary) hypertension: Secondary | ICD-10-CM

## 2020-01-12 DIAGNOSIS — K148 Other diseases of tongue: Secondary | ICD-10-CM

## 2020-01-12 DIAGNOSIS — E782 Mixed hyperlipidemia: Secondary | ICD-10-CM

## 2020-01-12 DIAGNOSIS — E538 Deficiency of other specified B group vitamins: Secondary | ICD-10-CM

## 2020-01-12 DIAGNOSIS — D72829 Elevated white blood cell count, unspecified: Secondary | ICD-10-CM

## 2020-01-13 ENCOUNTER — Other Ambulatory Visit: Payer: Self-pay | Admitting: Family Medicine

## 2020-01-13 LAB — LIPID PANEL
Chol/HDL Ratio: 6.7 ratio — ABNORMAL HIGH (ref 0.0–4.4)
Cholesterol, Total: 174 mg/dL (ref 100–199)
HDL: 26 mg/dL — ABNORMAL LOW
LDL Chol Calc (NIH): 115 mg/dL — ABNORMAL HIGH (ref 0–99)
Triglycerides: 186 mg/dL — ABNORMAL HIGH (ref 0–149)
VLDL Cholesterol Cal: 33 mg/dL (ref 5–40)

## 2020-01-13 LAB — CBC WITH DIFFERENTIAL/PLATELET
Basophils Absolute: 0.1 x10E3/uL (ref 0.0–0.2)
Basos: 1 %
EOS (ABSOLUTE): 0.2 x10E3/uL (ref 0.0–0.4)
Eos: 2 %
Hematocrit: 42.5 % (ref 34.0–46.6)
Hemoglobin: 14.5 g/dL (ref 11.1–15.9)
Immature Grans (Abs): 0 x10E3/uL (ref 0.0–0.1)
Immature Granulocytes: 0 %
Lymphocytes Absolute: 4.1 x10E3/uL — ABNORMAL HIGH (ref 0.7–3.1)
Lymphs: 38 %
MCH: 32.5 pg (ref 26.6–33.0)
MCHC: 34.1 g/dL (ref 31.5–35.7)
MCV: 95 fL (ref 79–97)
Monocytes Absolute: 0.7 x10E3/uL (ref 0.1–0.9)
Monocytes: 7 %
Neutrophils Absolute: 5.7 x10E3/uL (ref 1.4–7.0)
Neutrophils: 52 %
Platelets: 486 x10E3/uL — ABNORMAL HIGH (ref 150–450)
RBC: 4.46 x10E6/uL (ref 3.77–5.28)
RDW: 13 % (ref 11.7–15.4)
WBC: 10.8 x10E3/uL (ref 3.4–10.8)

## 2020-01-13 LAB — CMP14+EGFR
ALT: 45 IU/L — ABNORMAL HIGH (ref 0–32)
AST: 58 IU/L — ABNORMAL HIGH (ref 0–40)
Albumin/Globulin Ratio: 2 (ref 1.2–2.2)
Albumin: 4.8 g/dL (ref 3.8–4.8)
Alkaline Phosphatase: 106 IU/L (ref 48–121)
BUN/Creatinine Ratio: 14 (ref 9–23)
BUN: 10 mg/dL (ref 6–24)
Bilirubin Total: 0.5 mg/dL (ref 0.0–1.2)
CO2: 24 mmol/L (ref 20–29)
Calcium: 10.1 mg/dL (ref 8.7–10.2)
Chloride: 98 mmol/L (ref 96–106)
Creatinine, Ser: 0.74 mg/dL (ref 0.57–1.00)
GFR calc Af Amer: 111 mL/min/{1.73_m2} (ref >59)
GFR calc non Af Amer: 96 mL/min/{1.73_m2} (ref >59)
Globulin, Total: 2.4 g/dL (ref 1.5–4.5)
Glucose: 115 mg/dL — ABNORMAL HIGH (ref 65–99)
Potassium: 3.5 mmol/L (ref 3.5–5.2)
Sodium: 139 mmol/L (ref 134–144)
Total Protein: 7.2 g/dL (ref 6.0–8.5)

## 2020-01-13 LAB — VITAMIN B12: Vitamin B-12: 955 pg/mL (ref 232–1245)

## 2020-02-09 ENCOUNTER — Other Ambulatory Visit: Payer: Self-pay | Admitting: Family Medicine

## 2020-02-09 DIAGNOSIS — I10 Essential (primary) hypertension: Secondary | ICD-10-CM

## 2020-02-19 ENCOUNTER — Other Ambulatory Visit: Payer: Self-pay | Admitting: Family Medicine

## 2020-02-19 DIAGNOSIS — K760 Fatty (change of) liver, not elsewhere classified: Secondary | ICD-10-CM

## 2020-02-19 DIAGNOSIS — F419 Anxiety disorder, unspecified: Secondary | ICD-10-CM

## 2020-02-19 DIAGNOSIS — I10 Essential (primary) hypertension: Secondary | ICD-10-CM

## 2020-02-19 DIAGNOSIS — F329 Major depressive disorder, single episode, unspecified: Secondary | ICD-10-CM

## 2020-03-25 ENCOUNTER — Telehealth: Payer: Self-pay | Admitting: Family Medicine

## 2020-03-25 DIAGNOSIS — E782 Mixed hyperlipidemia: Secondary | ICD-10-CM

## 2020-03-25 DIAGNOSIS — E7849 Other hyperlipidemia: Secondary | ICD-10-CM

## 2020-03-25 NOTE — Telephone Encounter (Signed)
Patient aware that we have received PA and that we will try and have completed tomorrow and should have a response within 72 hours.

## 2020-03-26 NOTE — Telephone Encounter (Signed)
PA came in and started for pt  NEXLIZET 180-10mg  tab  For lipids E78.2 E78.49 Key: [id number] Sent to Plan today

## 2020-04-02 NOTE — Telephone Encounter (Signed)
Prior Auth for Nexlizet-DENIED  [id number];

## 2020-04-02 NOTE — Telephone Encounter (Signed)
Submitted new PA criteria Patient positive Bridget Gutierrez Diagnostic Criteria for Familial Hypercholesterolemia  Dorthey Sawyer Key: [facility] - PA Case ID: [id number]  Will follow up in 3 days

## 2020-04-02 NOTE — Telephone Encounter (Signed)
Can you help?

## 2020-04-06 ENCOUNTER — Encounter: Payer: Self-pay | Admitting: Family Medicine

## 2020-04-07 ENCOUNTER — Telehealth: Payer: Self-pay | Admitting: Pharmacist

## 2020-04-07 NOTE — Telephone Encounter (Signed)
   Call placed to patient to discuss Nexlizet PA info  Notes updated to reflect clinical indication (patient has familial HLD, updated ICD10)  Patient's LDL has decreased since being on additional therapy with Nexlizet  Updated info faxed to [place] PA dept [phone]  Case #[id number]  Await decision

## 2020-04-08 NOTE — Telephone Encounter (Signed)
Prior Auth for Nexlizet-DENIED

## 2020-04-09 NOTE — Telephone Encounter (Signed)
PA for Nexlizet- APPROVED through 04/09/21  Pharmacy aware

## 2020-04-13 NOTE — Telephone Encounter (Signed)
  Nexlizet covered for $10 copay Called pharmacy to verify Called patient to let her know

## 2020-05-23 ENCOUNTER — Other Ambulatory Visit: Payer: Self-pay | Admitting: Family Medicine

## 2020-05-23 DIAGNOSIS — K760 Fatty (change of) liver, not elsewhere classified: Secondary | ICD-10-CM

## 2020-05-23 DIAGNOSIS — F419 Anxiety disorder, unspecified: Secondary | ICD-10-CM

## 2020-05-23 DIAGNOSIS — F32A Depression, unspecified: Secondary | ICD-10-CM

## 2020-05-23 DIAGNOSIS — I10 Essential (primary) hypertension: Secondary | ICD-10-CM

## 2020-08-09 ENCOUNTER — Other Ambulatory Visit: Payer: Self-pay | Admitting: Family Medicine

## 2020-08-09 DIAGNOSIS — I10 Essential (primary) hypertension: Secondary | ICD-10-CM

## 2020-08-22 ENCOUNTER — Other Ambulatory Visit: Payer: Self-pay | Admitting: Family Medicine

## 2020-08-22 DIAGNOSIS — I10 Essential (primary) hypertension: Secondary | ICD-10-CM

## 2020-08-27 ENCOUNTER — Other Ambulatory Visit: Payer: Self-pay | Admitting: Family Medicine

## 2020-08-27 DIAGNOSIS — M792 Neuralgia and neuritis, unspecified: Secondary | ICD-10-CM

## 2020-08-27 DIAGNOSIS — F32A Depression, unspecified: Secondary | ICD-10-CM

## 2020-08-27 DIAGNOSIS — K760 Fatty (change of) liver, not elsewhere classified: Secondary | ICD-10-CM

## 2020-09-06 ENCOUNTER — Other Ambulatory Visit: Payer: Self-pay | Admitting: Family Medicine

## 2020-09-06 DIAGNOSIS — I10 Essential (primary) hypertension: Secondary | ICD-10-CM

## 2020-09-06 NOTE — Telephone Encounter (Signed)
[place]. NTBS 30 days given 08/10/20

## 2020-09-20 ENCOUNTER — Other Ambulatory Visit: Payer: Self-pay | Admitting: Family Medicine

## 2020-09-20 DIAGNOSIS — I10 Essential (primary) hypertension: Secondary | ICD-10-CM

## 2020-09-24 ENCOUNTER — Other Ambulatory Visit: Payer: Self-pay | Admitting: Family Medicine

## 2020-09-24 DIAGNOSIS — K760 Fatty (change of) liver, not elsewhere classified: Secondary | ICD-10-CM

## 2020-09-24 DIAGNOSIS — F419 Anxiety disorder, unspecified: Secondary | ICD-10-CM

## 2020-09-24 DIAGNOSIS — F32A Depression, unspecified: Secondary | ICD-10-CM

## 2020-09-29 ENCOUNTER — Other Ambulatory Visit: Payer: Self-pay | Admitting: Family Medicine

## 2020-09-29 DIAGNOSIS — M792 Neuralgia and neuritis, unspecified: Secondary | ICD-10-CM

## 2020-10-20 ENCOUNTER — Other Ambulatory Visit: Payer: Self-pay | Admitting: Family Medicine

## 2020-10-20 DIAGNOSIS — K760 Fatty (change of) liver, not elsewhere classified: Secondary | ICD-10-CM

## 2020-10-20 DIAGNOSIS — I10 Essential (primary) hypertension: Secondary | ICD-10-CM

## 2020-10-20 DIAGNOSIS — F419 Anxiety disorder, unspecified: Secondary | ICD-10-CM

## 2020-10-26 ENCOUNTER — Other Ambulatory Visit: Payer: Self-pay

## 2020-10-26 ENCOUNTER — Encounter: Payer: Self-pay | Admitting: Family Medicine

## 2020-10-26 ENCOUNTER — Ambulatory Visit: Payer: Managed Care, Other (non HMO) | Admitting: Family Medicine

## 2020-10-26 VITALS — BP 126/83 | HR 84 | Temp 97.5°F | Ht 64.0 in | Wt 190.6 lb

## 2020-10-26 DIAGNOSIS — F419 Anxiety disorder, unspecified: Secondary | ICD-10-CM

## 2020-10-26 DIAGNOSIS — K760 Fatty (change of) liver, not elsewhere classified: Secondary | ICD-10-CM

## 2020-10-26 DIAGNOSIS — F17200 Nicotine dependence, unspecified, uncomplicated: Secondary | ICD-10-CM

## 2020-10-26 DIAGNOSIS — E782 Mixed hyperlipidemia: Secondary | ICD-10-CM

## 2020-10-26 DIAGNOSIS — H0263 Xanthelasma of right eye, unspecified eyelid: Secondary | ICD-10-CM | POA: Diagnosis not present

## 2020-10-26 DIAGNOSIS — I10 Essential (primary) hypertension: Secondary | ICD-10-CM | POA: Diagnosis not present

## 2020-10-26 DIAGNOSIS — L819 Disorder of pigmentation, unspecified: Secondary | ICD-10-CM

## 2020-10-26 DIAGNOSIS — M792 Neuralgia and neuritis, unspecified: Secondary | ICD-10-CM

## 2020-10-26 DIAGNOSIS — F32A Depression, unspecified: Secondary | ICD-10-CM

## 2020-10-26 DIAGNOSIS — H0266 Xanthelasma of left eye, unspecified eyelid: Secondary | ICD-10-CM

## 2020-10-26 MED ORDER — AMLODIPINE BESYLATE 5 MG PO TABS: 1.0000 | ORAL_TABLET | Freq: Every day | ORAL | 3 refills | Status: DC

## 2020-10-26 MED ORDER — ATORVASTATIN CALCIUM 40 MG PO TABS: 1.0000 | ORAL_TABLET | Freq: Every day | ORAL | 3 refills | Status: DC

## 2020-10-26 MED ORDER — OMEGA-3-ACID ETHYL ESTERS 1 G PO CAPS: 2.0000 | ORAL_CAPSULE | Freq: Two times a day (BID) | ORAL | 3 refills | Status: DC

## 2020-10-26 MED ORDER — GABAPENTIN 300 MG PO CAPS: ORAL_CAPSULE | ORAL | 3 refills | Status: DC

## 2020-10-26 MED ORDER — HYDROCHLOROTHIAZIDE 25 MG PO TABS: 1.0000 | ORAL_TABLET | Freq: Every day | ORAL | 3 refills | Status: DC

## 2020-10-26 MED ORDER — ESCITALOPRAM OXALATE 10 MG PO TABS: 1.0000 | ORAL_TABLET | Freq: Every day | ORAL | 3 refills | Status: DC

## 2020-10-26 MED ORDER — NEXLIZET 180-10 MG PO TABS: 1.0000 | ORAL_TABLET | Freq: Every day | ORAL | 3 refills | Status: DC

## 2020-10-26 NOTE — Progress Notes (Signed)
Subjective: CC: HTN, HLD, GAD/ Depression PCP: Janora Norlander, DO AYT:KZSW F Dunnavant is a 50 y.o. female presenting to clinic today for:  1. HTN w/ HLD and NAFLD She reports compliance with her Norvasc, Lipitor, Nexlizet, HCTZ, and lovaza.  No reports of chest pain, shortness of breath  2. GAD/ Depression Patient reports stability with Lexapro.  She needs refills  3. Neuralgia She admits to having increased her gabapentin dose to 600 mg at bedtime about 2 weeks ago.  Denies any excessive daytime sedation.  She continues to take 300 mg each morning.  The symptoms seem to be mostly worse at night and the increased dose is helping.   ROS: Per HPI  No Known Allergies Past Medical History:  Diagnosis Date  . Anxiety   . B12 deficiency   . Depression   . Fatty liver   . Hyperlipidemia   . Hypertension   . Tubular adenoma of colon 2019    Current Outpatient Medications:  .  amLODipine (NORVASC) 5 MG tablet, Take 1 tablet (5 mg total) by mouth daily. (NEEDS TO BE SEEN BEFORE NEXT REFILL), Disp: 30 tablet, Rfl: 0 .  atorvastatin (LIPITOR) 40 MG tablet, Take 1 tablet (40 mg total) by mouth daily. (NEEDS TO BE SEEN BEFORE NEXT REFILL), Disp: 30 tablet, Rfl: 0 .  Bempedoic Acid-Ezetimibe (NEXLIZET) 180-10 MG TABS, Take 1 tablet by mouth daily., Disp: 90 tablet, Rfl: 2 .  cetirizine (ZYRTEC) 10 MG tablet, Take 1 tablet (10 mg total) by mouth daily., Disp: 30 tablet, Rfl: 11 .  cyanocobalamin 500 MCG tablet, Take 500 mcg by mouth daily. , Disp: , Rfl:  .  escitalopram (LEXAPRO) 10 MG tablet, Take 1 tablet (10 mg total) by mouth daily. (NEEDS TO BE SEEN BEFORE NEXT REFILL), Disp: 30 tablet, Rfl: 0 .  gabapentin (NEURONTIN) 300 MG capsule, Take 1 capsule by mouth twice daily, Disp: 60 capsule, Rfl: 0 .  hydrochlorothiazide (HYDRODIURIL) 25 MG tablet, Take 1 tablet (25 mg total) by mouth daily. (NEEDS TO BE SEEN BEFORE NEXT REFILL), Disp: 30 tablet, Rfl: 0 .  omega-3 acid ethyl esters  (LOVAZA) 1 g capsule, Take 2 capsules by mouth twice daily, Disp: 120 capsule, Rfl: 0 Social History   Socioeconomic History  . Marital status: Married    Spouse name: Not on file  . Number of children: Not on file  . Years of education: Not on file  . Highest education level: Not on file  Occupational History  . Not on file  Tobacco Use  . Smoking status: Current Every Day Smoker    Packs/day: 1.50    Years: 15.00    Pack years: 22.50  . Smokeless tobacco: Never Used  Vaping Use  . Vaping Use: Never used  Substance and Sexual Activity  . Alcohol use: No    Comment: Former 28 cans beer/ week  . Drug use: No  . Sexual activity: Not Currently  Other Topics Concern  . Not on file  Social History Narrative  . Not on file   Social Determinants of Health   Financial Resource Strain: Not on file  Food Insecurity: Not on file  Transportation Needs: Not on file  Physical Activity: Not on file  Stress: Not on file  Social Connections: Not on file  Intimate Partner Violence: Not on file   Family History  Problem Relation Age of Onset  . COPD Mother   . Hypertension Mother   . Cancer Mother   .  Hyperlipidemia Mother   . Cervical cancer Mother   . Heart disease Mother   . Heart attack Mother   . Hypertension Father   . Cancer Sister   . Colon cancer Sister 30  . Testicular cancer Brother   . Anxiety disorder Daughter     Objective: Office vital signs reviewed. BP 126/83   Pulse 84   Temp (!) 97.5 F (36.4 C)   Ht '5\' 4"'  (1.626 m)   Wt 190 lb 9.6 oz (86.5 kg)   LMP 09/17/2017 (Approximate)   SpO2 97%   BMI 32.72 kg/m   Physical Examination:  General: Awake, alert, well nourished, No acute distress HEENT: Sclera white.  Xanthelasmas noted along bilateral eyelids; TMs intact bilaterally.  No cerumen in external auditory canals Cardio: regular rate and rhythm, S1S2 heard, no murmurs appreciated Pulm: clear to auscultation bilaterally, no wheezes, rhonchi or  rales; normal work of breathing on room air Extremities: warm, well perfused, No edema, cyanosis or clubbing; +2 pulses bilaterally MSK: normal gait and station Skin: She has some hyperkeratotic lesions noted along the left shoulder and left lower leg.    Assessment/ Plan: 50 y.o. female   Mixed hyperlipidemia - Plan: CMP14+EGFR, Lipid panel, TSH, Bempedoic Acid-Ezetimibe (NEXLIZET) 180-10 MG TABS, omega-3 acid ethyl esters (LOVAZA) 1 g capsule, TSH, Lipid panel, CMP14+EGFR  Xanthelasma of eyelid, bilateral - Plan: Lipid panel, omega-3 acid ethyl esters (LOVAZA) 1 g capsule, Lipid panel  Fatty liver disease, nonalcoholic - Plan: NIO27+OJJK, atorvastatin (LIPITOR) 40 MG tablet, omega-3 acid ethyl esters (LOVAZA) 1 g capsule, CMP14+EGFR  Essential hypertension - Plan: CMP14+EGFR, amLODipine (NORVASC) 5 MG tablet, hydrochlorothiazide (HYDRODIURIL) 25 MG tablet, CMP14+EGFR  Tobacco use disorder - Plan: CBC, CBC  Peripheral neuralgia - Plan: gabapentin (NEURONTIN) 300 MG capsule  Anxiety and depression - Plan: escitalopram (LEXAPRO) 10 MG tablet  Pigmented skin lesion of uncertain nature  Plan for fasting lipid panel, CMP.  All meds have been refilled  Blood pressure is at goal.  Continue current regimen  Gabapentin dose adjusted.  Advised her to contact me should she need adjustments going forward.  These should be monitored by physician  Lexapro renewed.  Advised her to follow-up with her dermatologist ASAP for the lesions on the shoulder and lower leg.  I worry that these may be squamous cell carcinoma  She will reschedule her physical at her earliest convenience.  Advised of our late policy today  No orders of the defined types were placed in this encounter.  No orders of the defined types were placed in this encounter.  She will schedule with OB/GYN for what sounds like vaginal prolapse  Khya Halls Windell Moulding, DO [place] [phone]

## 2020-10-26 NOTE — Patient Instructions (Signed)
Schedule with OB/GYN, suspect vaginal prolapse  Schedule with Dr Nevada Crane.  Worried spot on shoulder might be squamous cell cancer of the skin  Come in for fasting labs ASAP

## 2020-10-27 ENCOUNTER — Other Ambulatory Visit: Payer: Self-pay | Admitting: Family Medicine

## 2020-10-27 DIAGNOSIS — R7989 Other specified abnormal findings of blood chemistry: Secondary | ICD-10-CM

## 2020-10-27 DIAGNOSIS — E876 Hypokalemia: Secondary | ICD-10-CM

## 2020-10-27 DIAGNOSIS — D72829 Elevated white blood cell count, unspecified: Secondary | ICD-10-CM

## 2020-10-27 LAB — TSH: TSH: 1.89 u[IU]/mL (ref 0.450–4.500)

## 2020-10-27 LAB — CMP14+EGFR
ALT: 70 IU/L — ABNORMAL HIGH (ref 0–32)
AST: 114 IU/L — ABNORMAL HIGH (ref 0–40)
Albumin/Globulin Ratio: 2 (ref 1.2–2.2)
Albumin: 4.9 g/dL — ABNORMAL HIGH (ref 3.8–4.8)
Alkaline Phosphatase: 112 IU/L (ref 44–121)
BUN/Creatinine Ratio: 8 — ABNORMAL LOW (ref 9–23)
BUN: 6 mg/dL (ref 6–24)
Bilirubin Total: 0.5 mg/dL (ref 0.0–1.2)
CO2: 24 mmol/L (ref 20–29)
Calcium: 10.1 mg/dL (ref 8.7–10.2)
Chloride: 94 mmol/L — ABNORMAL LOW (ref 96–106)
Creatinine, Ser: 0.72 mg/dL (ref 0.57–1.00)
Globulin, Total: 2.5 g/dL (ref 1.5–4.5)
Glucose: 141 mg/dL — ABNORMAL HIGH (ref 65–99)
Potassium: 2.9 mmol/L — ABNORMAL LOW (ref 3.5–5.2)
Sodium: 137 mmol/L (ref 134–144)
Total Protein: 7.4 g/dL (ref 6.0–8.5)
eGFR: 102 mL/min/{1.73_m2} (ref >59)

## 2020-10-27 LAB — CBC
Hematocrit: 41.9 % (ref 34.0–46.6)
Hemoglobin: 14.6 g/dL (ref 11.1–15.9)
MCH: 32.6 pg (ref 26.6–33.0)
MCHC: 34.8 g/dL (ref 31.5–35.7)
MCV: 94 fL (ref 79–97)
Platelets: 496 10*3/uL — ABNORMAL HIGH (ref 150–450)
RBC: 4.48 x10E6/uL (ref 3.77–5.28)
RDW: 13 % (ref 11.7–15.4)
WBC: 10.9 10*3/uL — ABNORMAL HIGH (ref 3.4–10.8)

## 2020-10-27 LAB — LIPID PANEL
Chol/HDL Ratio: 9.2 ratio — ABNORMAL HIGH (ref 0.0–4.4)
Cholesterol, Total: 202 mg/dL — ABNORMAL HIGH (ref 100–199)
HDL: 22 mg/dL — ABNORMAL LOW (ref >39)
LDL Chol Calc (NIH): 143 mg/dL — ABNORMAL HIGH (ref 0–99)
Triglycerides: 204 mg/dL — ABNORMAL HIGH (ref 0–149)
VLDL Cholesterol Cal: 37 mg/dL (ref 5–40)

## 2020-10-27 MED ORDER — POTASSIUM CHLORIDE CRYS ER 20 MEQ PO TBCR: 20.0000 meq | EXTENDED_RELEASE_TABLET | Freq: Two times a day (BID) | ORAL | 0 refills | Status: DC

## 2020-11-09 ENCOUNTER — Other Ambulatory Visit: Payer: Self-pay

## 2020-11-09 ENCOUNTER — Other Ambulatory Visit: Payer: Managed Care, Other (non HMO)

## 2020-11-09 DIAGNOSIS — R7989 Other specified abnormal findings of blood chemistry: Secondary | ICD-10-CM

## 2020-11-09 DIAGNOSIS — E876 Hypokalemia: Secondary | ICD-10-CM

## 2020-11-09 DIAGNOSIS — D72829 Elevated white blood cell count, unspecified: Secondary | ICD-10-CM

## 2020-11-10 ENCOUNTER — Other Ambulatory Visit: Payer: Self-pay | Admitting: Family Medicine

## 2020-11-10 DIAGNOSIS — E876 Hypokalemia: Secondary | ICD-10-CM

## 2020-11-10 LAB — CBC WITH DIFFERENTIAL/PLATELET
Basophils Absolute: 0.1 10*3/uL (ref 0.0–0.2)
Basos: 1 %
EOS (ABSOLUTE): 0.2 10*3/uL (ref 0.0–0.4)
Eos: 2 %
Hematocrit: 41.6 % (ref 34.0–46.6)
Hemoglobin: 14.3 g/dL (ref 11.1–15.9)
Immature Grans (Abs): 0 10*3/uL (ref 0.0–0.1)
Immature Granulocytes: 0 %
Lymphocytes Absolute: 3.9 10*3/uL — ABNORMAL HIGH (ref 0.7–3.1)
Lymphs: 38 %
MCH: 32.4 pg (ref 26.6–33.0)
MCHC: 34.4 g/dL (ref 31.5–35.7)
MCV: 94 fL (ref 79–97)
Monocytes Absolute: 0.5 10*3/uL (ref 0.1–0.9)
Monocytes: 5 %
Neutrophils Absolute: 5.6 10*3/uL (ref 1.4–7.0)
Neutrophils: 54 %
Platelets: 502 10*3/uL — ABNORMAL HIGH (ref 150–450)
RBC: 4.42 x10E6/uL (ref 3.77–5.28)
RDW: 13.1 % (ref 11.7–15.4)
WBC: 10.3 10*3/uL (ref 3.4–10.8)

## 2020-11-10 LAB — BASIC METABOLIC PANEL
BUN/Creatinine Ratio: 8 — ABNORMAL LOW (ref 9–23)
BUN: 6 mg/dL (ref 6–24)
CO2: 22 mmol/L (ref 20–29)
Calcium: 10 mg/dL (ref 8.7–10.2)
Chloride: 98 mmol/L (ref 96–106)
Creatinine, Ser: 0.79 mg/dL (ref 0.57–1.00)
Glucose: 181 mg/dL — ABNORMAL HIGH (ref 65–99)
Potassium: 3 mmol/L — ABNORMAL LOW (ref 3.5–5.2)
Sodium: 139 mmol/L (ref 134–144)
eGFR: 92 mL/min/{1.73_m2} (ref >59)

## 2020-11-10 LAB — MAGNESIUM: Magnesium: 1.8 mg/dL (ref 1.6–2.3)

## 2020-11-10 MED ORDER — POTASSIUM CHLORIDE CRYS ER 20 MEQ PO TBCR: 20.0000 meq | EXTENDED_RELEASE_TABLET | Freq: Every day | ORAL | 3 refills | Status: DC

## 2020-11-11 LAB — PATHOLOGIST SMEAR REVIEW
Basophils Absolute: 0.1 10*3/uL (ref 0.0–0.2)
Basos: 1 %
EOS (ABSOLUTE): 0.2 10*3/uL (ref 0.0–0.4)
Eos: 2 %
Hematocrit: 43.7 % (ref 34.0–46.6)
Hemoglobin: 14.5 g/dL (ref 11.1–15.9)
Immature Grans (Abs): 0 10*3/uL (ref 0.0–0.1)
Immature Granulocytes: 0 %
Lymphocytes Absolute: 3.9 10*3/uL — ABNORMAL HIGH (ref 0.7–3.1)
Lymphs: 38 %
MCH: 31.6 pg (ref 26.6–33.0)
MCHC: 33.2 g/dL (ref 31.5–35.7)
MCV: 95 fL (ref 79–97)
Monocytes Absolute: 0.5 10*3/uL (ref 0.1–0.9)
Monocytes: 5 %
Neutrophils Absolute: 5.5 10*3/uL (ref 1.4–7.0)
Neutrophils: 54 %
Platelets: 479 10*3/uL — ABNORMAL HIGH (ref 150–450)
RBC: 4.59 x10E6/uL (ref 3.77–5.28)
RDW: 13.1 % (ref 11.7–15.4)
WBC: 10.2 10*3/uL (ref 3.4–10.8)

## 2020-11-12 ENCOUNTER — Other Ambulatory Visit: Payer: Self-pay | Admitting: Family Medicine

## 2020-11-12 DIAGNOSIS — D72820 Lymphocytosis (symptomatic): Secondary | ICD-10-CM

## 2020-11-15 ENCOUNTER — Other Ambulatory Visit: Payer: Self-pay

## 2020-11-15 ENCOUNTER — Inpatient Hospital Stay (HOSPITAL_COMMUNITY): Payer: Managed Care, Other (non HMO) | Attending: Hematology | Admitting: Hematology

## 2020-11-15 VITALS — BP 128/78 | HR 80 | Temp 97.2°F | Resp 18 | Ht 64.0 in | Wt 188.6 lb

## 2020-11-15 DIAGNOSIS — Z79899 Other long term (current) drug therapy: Secondary | ICD-10-CM | POA: Diagnosis not present

## 2020-11-15 DIAGNOSIS — K760 Fatty (change of) liver, not elsewhere classified: Secondary | ICD-10-CM | POA: Diagnosis not present

## 2020-11-15 DIAGNOSIS — F419 Anxiety disorder, unspecified: Secondary | ICD-10-CM | POA: Insufficient documentation

## 2020-11-15 DIAGNOSIS — F32A Depression, unspecified: Secondary | ICD-10-CM | POA: Insufficient documentation

## 2020-11-15 DIAGNOSIS — I10 Essential (primary) hypertension: Secondary | ICD-10-CM | POA: Diagnosis not present

## 2020-11-15 DIAGNOSIS — D75839 Thrombocytosis, unspecified: Secondary | ICD-10-CM | POA: Diagnosis not present

## 2020-11-15 DIAGNOSIS — Z8049 Family history of malignant neoplasm of other genital organs: Secondary | ICD-10-CM | POA: Insufficient documentation

## 2020-11-15 DIAGNOSIS — Z800 Family history of malignant neoplasm of digestive organs: Secondary | ICD-10-CM | POA: Insufficient documentation

## 2020-11-15 DIAGNOSIS — E785 Hyperlipidemia, unspecified: Secondary | ICD-10-CM | POA: Insufficient documentation

## 2020-11-15 DIAGNOSIS — E538 Deficiency of other specified B group vitamins: Secondary | ICD-10-CM | POA: Diagnosis not present

## 2020-11-15 DIAGNOSIS — F17210 Nicotine dependence, cigarettes, uncomplicated: Secondary | ICD-10-CM | POA: Diagnosis not present

## 2020-11-15 DIAGNOSIS — Z780 Asymptomatic menopausal state: Secondary | ICD-10-CM | POA: Insufficient documentation

## 2020-11-15 DIAGNOSIS — D72820 Lymphocytosis (symptomatic): Secondary | ICD-10-CM | POA: Diagnosis not present

## 2020-11-15 NOTE — Progress Notes (Signed)
[facility] [address], [place] [address]   CLINIC:  Medical Oncology/Hematology  Patient Care Team: Janora Norlander, DO as PCP - General (Family Medicine) Lavera Guise, [facility] (Pharmacist)  CHIEF COMPLAINTS/PURPOSE OF CONSULTATION:  Evaluation of lymphocytosis  HISTORY OF PRESENTING ILLNESS:  Bridget Gutierrez 50 y.o. 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 Gottschalk, DO.  Today she reports feeling fair. She has a B-12 defiencey which is being treated with OTC vitamin B-12. She has chronic fatty liver and hereditary cholesterol. She has mild night sweats which she attributes to menopause. She denies any recent infections; she last took antibiotics 2 years ago. She has never had a blood transfusion. She reports normal levels of activity at home. She denies any rashes.  She smoke over 1 ppd for 31 years. 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 She reports mild chemical exposure from [employer].  She denies history of lupus or RA. Her brother had testicular cancer and possible leukemia. Her sister had colon cancer at 97 y.o. Her mother had cervical cancer. Her maternal grandmother had cancer-it spread throughout body with an unknown origin point.   MEDICAL HISTORY:  Past Medical History:  Diagnosis Date  . Anxiety   . B12 deficiency   . Depression   . Fatty liver   . Hyperlipidemia   . Hypertension   . Tubular adenoma of colon 2019    SURGICAL HISTORY: Past Surgical History:  Procedure Laterality Date  . CESAREAN SECTION  2000  . COLONOSCOPY WITH PROPOFOL N/A 09/27/2017   Procedure: COLONOSCOPY WITH PROPOFOL;  Surgeon: Jonathon Bellows, MD;  Location: [facility] ENDOSCOPY;  Service: Gastroenterology;  Laterality: N/A;  . TONSILLECTOMY      SOCIAL HISTORY: Social History   Socioeconomic History  . Marital status: Married    Spouse name: Not on  file  . Number of children: Not on file  . Years of education: Not on file  . Highest education level: Not on file  Occupational History  . Not on file  Tobacco Use  . Smoking status: Current Every Day Smoker    Packs/day: 1.50    Years: 15.00    Pack years: 22.50  . Smokeless tobacco: Never Used  Vaping Use  . Vaping Use: Never used  Substance and Sexual Activity  . Alcohol use: No    Comment: Former 28 cans beer/ week  . Drug use: No  . Sexual activity: Not Currently  Other Topics Concern  . Not on file  Social History Narrative  . Not on file   Social Determinants of Health   Financial Resource Strain: Low Risk   . Difficulty of Paying Living Expenses: Not hard at all  Food Insecurity: No Food Insecurity  . Worried About Charity fundraiser in the Last Year: Never true  . Ran Out of Food in the Last Year: Never true  Transportation Needs: No Transportation Needs  . Lack of Transportation (Medical): No  . Lack of Transportation (Non-Medical): No  Physical Activity: Insufficiently Active  . Days of Exercise per Week: 1 day  . Minutes of Exercise per Session: 30 min  Stress: No Stress Concern Present  . Feeling of Stress : Only a little  Social Connections: Moderately Isolated  . Frequency of Communication with Friends and Family: More than three times a week  . Frequency of  Social Gatherings with Friends and Family: Once a week  . Attends Religious Services: Never  . Active Member of Clubs or Organizations: No  . Attends Archivist Meetings: Never  . Marital Status: Married  Human resources officer Violence: Not At Risk  . Fear of Current or Ex-Partner: No  . Emotionally Abused: No  . Physically Abused: No  . Sexually Abused: No    FAMILY HISTORY: Family History  Problem Relation Age of Onset  . COPD Mother   . Hypertension Mother   . Cancer Mother   . Hyperlipidemia Mother   . Cervical cancer Mother   . Heart disease Mother   . Heart attack Mother    . Hypertension Father   . Cancer Sister   . Colon cancer Sister 72  . Testicular cancer Brother   . Anxiety disorder Daughter     ALLERGIES:  has No Known Allergies.  MEDICATIONS:  Current Outpatient Medications  Medication Sig Dispense Refill  . amLODipine (NORVASC) 5 MG tablet Take 1 tablet (5 mg total) by mouth daily. 90 tablet 3  . atorvastatin (LIPITOR) 40 MG tablet Take 1 tablet (40 mg total) by mouth daily. 90 tablet 3  . Bempedoic Acid-Ezetimibe (NEXLIZET) 180-10 MG TABS Take 1 tablet by mouth daily. 90 tablet 3  . cetirizine (ZYRTEC) 10 MG tablet Take 1 tablet (10 mg total) by mouth daily. 30 tablet 11  . cyanocobalamin 500 MCG tablet Take 500 mcg by mouth daily.     [name] escitalopram (LEXAPRO) 10 MG tablet Take 1 tablet (10 mg total) by mouth daily. 90 tablet 3  . gabapentin (NEURONTIN) 300 MG capsule Take 1 capsule each morning and 2 capsules each evening for neuropathy 270 capsule 3  . hydrochlorothiazide (HYDRODIURIL) 25 MG tablet Take 1 tablet (25 mg total) by mouth daily. 90 tablet 3  . omega-3 acid ethyl esters (LOVAZA) 1 g capsule Take 2 capsules (2 g total) by mouth 2 (two) times daily. 360 capsule 3  . potassium chloride SA (KLOR-CON) 20 MEQ tablet Take 1 tablet (20 mEq total) by mouth daily. 90 tablet 3   No current facility-administered medications for this visit.    REVIEW OF SYSTEMS:   Review of Systems  Constitutional: Positive for appetite change (50%) and fatigue (50%).  Respiratory: Positive for cough (smoker).   Gastrointestinal: Positive for constipation.  Genitourinary: Positive for difficulty urinating (frequent at night).   Skin: Negative for rash.  Neurological: Positive for dizziness, headaches and numbness.  Psychiatric/Behavioral: Positive for sleep disturbance.  All other systems reviewed and are negative.    PHYSICAL EXAMINATION: ECOG PERFORMANCE STATUS: 0 - Asymptomatic  Vitals:   11/15/20 1526  BP: 128/78  Pulse: 80  Resp: 18   Temp: (!) 97.2 F (36.2 C)  SpO2: 99%   Filed Weights   11/15/20 1526  Weight: 188 lb 9.6 oz (85.5 kg)   Physical Exam Vitals reviewed.  Constitutional:      Appearance: Normal appearance.  Cardiovascular:     Rate and Rhythm: Normal rate and regular rhythm.     Pulses: Normal pulses.     Heart sounds: Normal heart sounds.  Pulmonary:     Effort: Pulmonary effort is normal.     Breath sounds: Normal breath sounds.  Chest:  Breasts:     Right: No axillary adenopathy or supraclavicular adenopathy.     Left: No axillary adenopathy or supraclavicular adenopathy.    Abdominal:     Palpations: Abdomen is soft. There is  no hepatomegaly, splenomegaly or mass.     Tenderness: There is no abdominal tenderness.     Hernia: There is no hernia in the left inguinal area or right inguinal area.  Musculoskeletal:     Right lower leg: No edema.     Left lower leg: No edema.  Lymphadenopathy:     Cervical: No cervical adenopathy.     Right cervical: No superficial cervical adenopathy.    Left cervical: No superficial cervical adenopathy.     Upper Body:     Right upper body: No supraclavicular, axillary or pectoral adenopathy.     Left upper body: No supraclavicular, axillary or pectoral adenopathy.  Neurological:     General: No focal deficit present.     Mental Status: She is alert and oriented to person, place, and time.  Psychiatric:        Mood and Affect: Mood normal.        Behavior: Behavior normal.      LABORATORY DATA:  I have reviewed the data as listed Recent Results (from the past 2160 hour(s))  CBC     Status: Abnormal   Collection Time: 10/26/20 10:46 AM  Result Value Ref Range   WBC 10.9 (H) 3.4 - 10.8 x10E3/uL   RBC 4.48 3.77 - 5.28 x10E6/uL   Hemoglobin 14.6 11.1 - 15.9 g/dL   Hematocrit 41.9 34.0 - 46.6 %   MCV 94 79 - 97 fL   MCH 32.6 26.6 - 33.0 pg   MCHC 34.8 31.5 - 35.7 g/dL   RDW 13.0 11.7 - 15.4 %   Platelets 496 (H) 150 - 450 x10E3/uL  TSH      Status: None   Collection Time: 10/26/20 10:46 AM  Result Value Ref Range   TSH 1.890 0.450 - 4.500 uIU/mL  Lipid panel     Status: Abnormal   Collection Time: 10/26/20 10:46 AM  Result Value Ref Range   Cholesterol, Total 202 (H) 100 - 199 mg/dL   Triglycerides 204 (H) 0 - 149 mg/dL   HDL 22 (L) >39 mg/dL   VLDL Cholesterol Cal 37 5 - 40 mg/dL   LDL Chol Calc (NIH) 143 (H) 0 - 99 mg/dL   Chol/HDL Ratio 9.2 (H) 0.0 - 4.4 ratio    Comment:                                   T. Chol/HDL Ratio                                             Men  Women                               1/2 Avg.Risk  3.4    3.3                                   Avg.Risk  5.0    4.4                                2X Avg.Risk  9.6    7.1  3X Avg.Risk 23.4   11.0   CMP14+EGFR     Status: Abnormal   Collection Time: 10/26/20 10:46 AM  Result Value Ref Range   Glucose 141 (H) 65 - 99 mg/dL   BUN 6 6 - 24 mg/dL   Creatinine, Ser 0.72 0.57 - 1.00 mg/dL   eGFR 102 >59 mL/min/1.73   BUN/Creatinine Ratio 8 (L) 9 - 23   Sodium 137 134 - 144 mmol/L   Potassium 2.9 (L) 3.5 - 5.2 mmol/L   Chloride 94 (L) 96 - 106 mmol/L   CO2 24 20 - 29 mmol/L   Calcium 10.1 8.7 - 10.2 mg/dL   Total Protein 7.4 6.0 - 8.5 g/dL   Albumin 4.9 (H) 3.8 - 4.8 g/dL   Globulin, Total 2.5 1.5 - 4.5 g/dL   Albumin/Globulin Ratio 2.0 1.2 - 2.2   Bilirubin Total 0.5 0.0 - 1.2 mg/dL   Alkaline Phosphatase 112 44 - 121 IU/L   AST 114 (H) 0 - 40 IU/L   ALT 70 (H) 0 - 32 IU/L  CBC with Differential/Platelet     Status: Abnormal   Collection Time: 11/09/20 10:53 AM  Result Value Ref Range   WBC 10.3 3.4 - 10.8 x10E3/uL   RBC 4.42 3.77 - 5.28 x10E6/uL   Hemoglobin 14.3 11.1 - 15.9 g/dL   Hematocrit 41.6 34.0 - 46.6 %   MCV 94 79 - 97 fL   MCH 32.4 26.6 - 33.0 pg   MCHC 34.4 31.5 - 35.7 g/dL   RDW 13.1 11.7 - 15.4 %   Platelets 502 (H) 150 - 450 x10E3/uL   Neutrophils 54 Not Estab. %   Lymphs 38 Not Estab. %    Monocytes 5 Not Estab. %   Eos 2 Not Estab. %   Basos 1 Not Estab. %   Neutrophils Absolute 5.6 1.4 - 7.0 x10E3/uL   Lymphocytes Absolute 3.9 (H) 0.7 - 3.1 x10E3/uL   Monocytes Absolute 0.5 0.1 - 0.9 x10E3/uL   EOS (ABSOLUTE) 0.2 0.0 - 0.4 x10E3/uL   Basophils Absolute 0.1 0.0 - 0.2 x10E3/uL   Immature Granulocytes 0 Not Estab. %   Immature Grans (Abs) 0.0 0.0 - 0.1 x10E3/uL  Pathologist smear review     Status: Abnormal   Collection Time: 11/09/20 10:53 AM  Result Value Ref Range   Path Rev WBC Comment     Comment: Normal in number with a mild lymphocytosis. A polymorphous population of lymphocytes is identified.    Path Rev RBC Comment     Comment: Normochromic normocytic   Path Rev PLTs Comment     Comment: Mildly increased with a few large forms   PATH INTERP BLD-IMP Comment     Comment: Mild absolute lymphocytosis Comment: The significance of the mild lymphocytosis is uncertain. It may be reactive. Clinical correlation and repeat CBC is suggested in a few weeks. Thrombocytosis Comment: The differential diagnosis includes chronic myeloproliferative disorders and reactive conditions.    PATHOLOGIST NAME Comment     Comment: Reviewed by:  Delorse Lek, MD  Pathologist   WBC 10.2 3.4 - 10.8 x10E3/uL   RBC 4.59 3.77 - 5.28 x10E6/uL   Hemoglobin 14.5 11.1 - 15.9 g/dL   Hematocrit 43.7 34.0 - 46.6 %   MCV 95 79 - 97 fL   MCH 31.6 26.6 - 33.0 pg   MCHC 33.2 31.5 - 35.7 g/dL   RDW 13.1 11.7 - 15.4 %   Platelets 479 (H) 150 - 450 x10E3/uL   Neutrophils  54 Not Estab. %   Lymphs 38 Not Estab. %   Monocytes 5 Not Estab. %   Eos 2 Not Estab. %   Basos 1 Not Estab. %   Neutrophils Absolute 5.5 1.4 - 7.0 x10E3/uL   Lymphocytes Absolute 3.9 (H) 0.7 - 3.1 x10E3/uL   Monocytes Absolute 0.5 0.1 - 0.9 x10E3/uL   EOS (ABSOLUTE) 0.2 0.0 - 0.4 x10E3/uL   Basophils Absolute 0.1 0.0 - 0.2 x10E3/uL   Immature Granulocytes 0 Not Estab. %   Immature Grans (Abs) 0.0 0.0 - 0.1 x10E3/uL   Magnesium     Status: None   Collection Time: 11/09/20 10:53 AM  Result Value Ref Range   Magnesium 1.8 1.6 - 2.3 mg/dL  Basic metabolic panel     Status: Abnormal   Collection Time: 11/09/20 10:53 AM  Result Value Ref Range   Glucose 181 (H) 65 - 99 mg/dL   BUN 6 6 - 24 mg/dL   Creatinine, Ser 0.79 0.57 - 1.00 mg/dL   eGFR 92 >59 mL/min/1.73   BUN/Creatinine Ratio 8 (L) 9 - 23   Sodium 139 134 - 144 mmol/L   Potassium 3.0 (L) 3.5 - 5.2 mmol/L   Chloride 98 96 - 106 mmol/L   CO2 22 20 - 29 mmol/L   Calcium 10.0 8.7 - 10.2 mg/dL    RADIOGRAPHIC STUDIES: I have personally reviewed the radiological images as listed and agreed with the findings in the report. No results found.  ASSESSMENT:  1.  Lymphocytosis and thrombocytosis: - Patient seen at the request of Dr. Lajuana Ripple for further evaluation of abnormal CBC. - No B symptoms.  No recurrent infections.  No prior transfusion history.  2.  Social/family history: -Current active smoker, 1 pack/day for 31 years. - Currently staying at home.  Previously worked at a [employer] and few other places.  No significant exposure to chemicals. - Brother had testicular cancer and questionable leukemia.  Sister had colon cancer at age 18.  Mother had cervical cancer.  Maternal grandmother died of metastatic cancer.   PLAN:  1.  Lymphocytosis: - CBC on 11/09/2020 shows white count 10.2 with absolute lymphocyte count of 3900.  Rest of the differential was normal. - Review of her CBC over the last few years has shown persistent lymphocytosis with on and off mild leukocytosis. - Physical examination today did not reveal any palpable lymphadenopathy or splenomegaly. - Will repeat her CBC with differential, LDH and sent for flow cytometry to evaluate for B-cell lymphoproliferative disorders.  2.  Thrombocytosis: - She had persistently elevated platelet count since July 2021 although mild. - We will check for inflammatory markers including ESR  and CRP. - We will also check for JAK2 V6 1 7 and reflex mutation testing to evaluate for myeloproliferative disorders. - RTC 2 weeks for follow-up to discuss results.  All questions were answered. The patient knows to call the clinic with any problems, questions or concerns.   Derek Jack, MD 11/15/20 4:07 PM  [place] [phone]   I, Thana Ates, am acting as a scribe for Dr. Derek Jack.  I, Derek Jack MD, have reviewed the above documentation for accuracy and completeness, and I agree with the above.

## 2020-11-15 NOTE — Patient Instructions (Signed)
[facility] °Discharge Instructions ° °You were seen today by Dr. Katragadda. He went over your recent results. Dr. Katragadda will see you back in 2 weeks for labs and follow up. ° ° °Thank you for choosing [facility] at [facility] to provide your oncology and hematology care.  To afford each patient quality time with our provider, please arrive at least 15 minutes before your scheduled appointment time.  ° °If you have a lab appointment with the Cancer Center please come in thru the Main Entrance and check in at the main information desk ° °You need to re-schedule your appointment should you arrive 10 or more minutes late.  We strive to give you quality time with our providers, and arriving late affects you and other patients whose appointments are after yours.  Also, if you no show three or more times for appointments you may be dismissed from the clinic at the providers discretion.     °Again, thank you for choosing [facility].  Our hope is that these requests will decrease the amount of time that you wait before being seen by our physicians.       °_____________________________________________________________ ° °Should you have questions after your visit to  Cancer Center, please contact our office at [phone] between the hours of 8:00 a.m. and 4:30 p.m.  Voicemails left after 4:00 p.m. will not be returned until the following business day.  For prescription refill requests, have your pharmacy contact our office and allow 72 hours.   ° °Cancer Center Support Programs:  ° °> Cancer Support Group  °2nd Tuesday of the month 1pm-2pm, Journey Room  ° ° °

## 2020-11-16 ENCOUNTER — Inpatient Hospital Stay (HOSPITAL_COMMUNITY): Payer: Managed Care, Other (non HMO)

## 2020-11-16 DIAGNOSIS — D72820 Lymphocytosis (symptomatic): Secondary | ICD-10-CM

## 2020-11-16 LAB — COMPREHENSIVE METABOLIC PANEL
ALT: 56 U/L — ABNORMAL HIGH (ref 0–44)
AST: 92 U/L — ABNORMAL HIGH (ref 15–41)
Albumin: 4.4 g/dL (ref 3.5–5.0)
Alkaline Phosphatase: 94 U/L (ref 38–126)
Anion gap: 11 (ref 5–15)
BUN: 7 mg/dL (ref 6–20)
CO2: 26 mmol/L (ref 22–32)
Calcium: 9.7 mg/dL (ref 8.9–10.3)
Chloride: 98 mmol/L (ref 98–111)
Creatinine, Ser: 0.62 mg/dL (ref 0.44–1.00)
GFR, Estimated: >60 mL/min (ref >60)
Glucose, Bld: 156 mg/dL — ABNORMAL HIGH (ref 70–99)
Potassium: 3.1 mmol/L — ABNORMAL LOW (ref 3.5–5.1)
Sodium: 135 mmol/L (ref 135–145)
Total Bilirubin: 0.7 mg/dL (ref 0.3–1.2)
Total Protein: 8.3 g/dL — ABNORMAL HIGH (ref 6.5–8.1)

## 2020-11-16 LAB — RETICULOCYTES
Immature Retic Fract: 17.7 % — ABNORMAL HIGH (ref 2.3–15.9)
RBC.: 4.41 MIL/uL (ref 3.87–5.11)
Retic Count, Absolute: 75.4 10*3/uL (ref 19.0–186.0)
Retic Ct Pct: 1.7 % (ref 0.4–3.1)

## 2020-11-16 LAB — CBC WITH DIFFERENTIAL/PLATELET
Abs Immature Granulocytes: 0.03 10*3/uL (ref 0.00–0.07)
Basophils Absolute: 0.1 10*3/uL (ref 0.0–0.1)
Basophils Relative: 1 %
Eosinophils Absolute: 0.2 10*3/uL (ref 0.0–0.5)
Eosinophils Relative: 2 %
HCT: 41.9 % (ref 36.0–46.0)
Hemoglobin: 14.4 g/dL (ref 12.0–15.0)
Immature Granulocytes: 0 %
Lymphocytes Relative: 36 %
Lymphs Abs: 3.8 10*3/uL (ref 0.7–4.0)
MCH: 32.3 pg (ref 26.0–34.0)
MCHC: 34.4 g/dL (ref 30.0–36.0)
MCV: 93.9 fL (ref 80.0–100.0)
Monocytes Absolute: 0.7 10*3/uL (ref 0.1–1.0)
Monocytes Relative: 6 %
Neutro Abs: 5.7 10*3/uL (ref 1.7–7.7)
Neutrophils Relative %: 55 %
Platelets: 451 10*3/uL — ABNORMAL HIGH (ref 150–400)
RBC: 4.46 MIL/uL (ref 3.87–5.11)
RDW: 12.9 % (ref 11.5–15.5)
WBC: 10.4 10*3/uL (ref 4.0–10.5)
nRBC: 0 % (ref 0.0–0.2)

## 2020-11-16 LAB — IRON AND TIBC
Iron: 123 ug/dL (ref 28–170)
Saturation Ratios: 32 % — ABNORMAL HIGH (ref 10.4–31.8)
TIBC: 388 ug/dL (ref 250–450)
UIBC: 265 ug/dL

## 2020-11-16 LAB — SEDIMENTATION RATE: Sed Rate: 44 mm/hr — ABNORMAL HIGH (ref 0–22)

## 2020-11-16 LAB — LACTATE DEHYDROGENASE: LDH: 161 U/L (ref 98–192)

## 2020-11-16 LAB — FERRITIN: Ferritin: 186 ng/mL (ref 11–307)

## 2020-11-16 LAB — C-REACTIVE PROTEIN: CRP: 0.7 mg/dL (ref <1.0)

## 2020-11-17 LAB — SURGICAL PATHOLOGY

## 2020-11-28 LAB — CALR + JAK2 E12-15 + MPL (REFLEXED)

## 2020-11-28 LAB — JAK2 V617F, W REFLEX TO CALR/E12/MPL

## 2020-12-01 ENCOUNTER — Inpatient Hospital Stay (HOSPITAL_COMMUNITY): Payer: Managed Care, Other (non HMO) | Admitting: Hematology

## 2020-12-01 DIAGNOSIS — D75839 Thrombocytosis, unspecified: Secondary | ICD-10-CM

## 2020-12-01 DIAGNOSIS — D72820 Lymphocytosis (symptomatic): Secondary | ICD-10-CM

## 2020-12-07 ENCOUNTER — Other Ambulatory Visit: Payer: Self-pay | Admitting: Family

## 2020-12-07 DIAGNOSIS — J301 Allergic rhinitis due to pollen: Secondary | ICD-10-CM

## 2021-01-10 ENCOUNTER — Other Ambulatory Visit: Payer: Self-pay | Admitting: Family Medicine

## 2021-01-10 DIAGNOSIS — J301 Allergic rhinitis due to pollen: Secondary | ICD-10-CM

## 2021-03-28 ENCOUNTER — Telehealth: Payer: Self-pay | Admitting: *Deleted

## 2021-03-28 DIAGNOSIS — E782 Mixed hyperlipidemia: Secondary | ICD-10-CM

## 2021-03-28 NOTE — Telephone Encounter (Signed)
Drug Nexlizet 180-10MG  tablets PA started  Key: [id number]  Sent to plan

## 2021-04-11 NOTE — Telephone Encounter (Signed)
I have reviewed the request to cover Nexlizet 180mg -10mg  TABLET. The information submitted  did not meet the criteria necessary to approve this medication. Based on the information provided, I am unable to approve coverage for this medication because: There is no indication that Nexlizet (bempedoic acid/ezetimibe) is being used for the treatment of  either: 1. atherosclerotic cardiovascular disease (ASCVD); or 2. heterozygous familial  hypercholesterolemia (HeFH). Christella Scheuermann does not cover this drug for any other indication because it is  considered experimental, investigational, or unproven. This drug requires supportive documentation for all answers, including genetic testing, chart notes,  lab/test results, etc. This documentation was not included or was missing for some answers. Bempedoic acid/ezetimibe (Nexlizet) is considered medically necessary for either of the following:  1. Atherosclerotic Cardiovascular Disease; or 2. Heterozygous Familial Hypercholesterolemia  (HeFH). Bempedoic acid/ezetimibe (Nexlizet) is considered experimental, investigational, or  unproven for any other use.

## 2021-04-12 NOTE — Telephone Encounter (Signed)
Help.

## 2021-05-12 LAB — FLOW CYTOMETRY

## 2021-05-19 ENCOUNTER — Telehealth: Payer: Self-pay

## 2021-05-19 NOTE — Telephone Encounter (Signed)
Bridget Gutierrez Key: [id number] - PA Case ID: [id number]

## 2021-06-02 NOTE — Telephone Encounter (Signed)
Your request has been approved [id number];GITJLL:[id number];Review Type:Prior Auth;Coverage Start Date:05/31/2021;Coverage End Date:05/31/2022  Pharmacy and patient aware.

## 2021-10-09 ENCOUNTER — Other Ambulatory Visit: Payer: Self-pay | Admitting: Family Medicine

## 2021-10-09 DIAGNOSIS — J301 Allergic rhinitis due to pollen: Secondary | ICD-10-CM

## 2021-10-09 DIAGNOSIS — K760 Fatty (change of) liver, not elsewhere classified: Secondary | ICD-10-CM

## 2021-10-10 ENCOUNTER — Encounter: Payer: Self-pay | Admitting: Family Medicine

## 2021-10-10 NOTE — Telephone Encounter (Signed)
30 days given today - ntbs  ?

## 2021-10-23 ENCOUNTER — Other Ambulatory Visit: Payer: Self-pay | Admitting: Family Medicine

## 2021-10-23 DIAGNOSIS — K760 Fatty (change of) liver, not elsewhere classified: Secondary | ICD-10-CM

## 2021-10-23 DIAGNOSIS — F32A Depression, unspecified: Secondary | ICD-10-CM

## 2021-10-30 ENCOUNTER — Other Ambulatory Visit: Payer: Self-pay | Admitting: Family Medicine

## 2021-10-30 DIAGNOSIS — E876 Hypokalemia: Secondary | ICD-10-CM

## 2021-11-07 ENCOUNTER — Ambulatory Visit: Payer: Managed Care, Other (non HMO) | Admitting: Family Medicine

## 2021-11-07 ENCOUNTER — Encounter: Payer: Self-pay | Admitting: Family Medicine

## 2021-11-07 VITALS — BP 133/88 | HR 77 | Temp 98.2°F | Ht 64.0 in | Wt 182.8 lb

## 2021-11-07 DIAGNOSIS — I10 Essential (primary) hypertension: Secondary | ICD-10-CM

## 2021-11-07 DIAGNOSIS — H0263 Xanthelasma of right eye, unspecified eyelid: Secondary | ICD-10-CM

## 2021-11-07 DIAGNOSIS — M792 Neuralgia and neuritis, unspecified: Secondary | ICD-10-CM

## 2021-11-07 DIAGNOSIS — H9311 Tinnitus, right ear: Secondary | ICD-10-CM | POA: Diagnosis not present

## 2021-11-07 DIAGNOSIS — K760 Fatty (change of) liver, not elsewhere classified: Secondary | ICD-10-CM

## 2021-11-07 DIAGNOSIS — H0266 Xanthelasma of left eye, unspecified eyelid: Secondary | ICD-10-CM

## 2021-11-07 DIAGNOSIS — F419 Anxiety disorder, unspecified: Secondary | ICD-10-CM

## 2021-11-07 DIAGNOSIS — N751 Abscess of Bartholin's gland: Secondary | ICD-10-CM

## 2021-11-07 DIAGNOSIS — F32A Depression, unspecified: Secondary | ICD-10-CM

## 2021-11-07 DIAGNOSIS — E782 Mixed hyperlipidemia: Secondary | ICD-10-CM

## 2021-11-07 MED ORDER — FLUCONAZOLE 150 MG PO TABS: 150.0000 mg | ORAL_TABLET | Freq: Once | ORAL | 0 refills | Status: AC

## 2021-11-07 MED ORDER — HYDROCHLOROTHIAZIDE 25 MG PO TABS: 25.0000 mg | ORAL_TABLET | Freq: Every day | ORAL | 3 refills | Status: DC

## 2021-11-07 MED ORDER — ATORVASTATIN CALCIUM 40 MG PO TABS: 40.0000 mg | ORAL_TABLET | Freq: Every day | ORAL | 3 refills | Status: DC

## 2021-11-07 MED ORDER — AMLODIPINE BESYLATE 5 MG PO TABS: 5.0000 mg | ORAL_TABLET | Freq: Every day | ORAL | 3 refills | Status: DC

## 2021-11-07 MED ORDER — NEXLIZET 180-10 MG PO TABS: 1.0000 | ORAL_TABLET | Freq: Every day | ORAL | 3 refills | Status: DC

## 2021-11-07 MED ORDER — ESCITALOPRAM OXALATE 10 MG PO TABS: 10.0000 mg | ORAL_TABLET | Freq: Every day | ORAL | 3 refills | Status: DC

## 2021-11-07 MED ORDER — OMEGA-3-ACID ETHYL ESTERS 1 G PO CAPS: 2.0000 | ORAL_CAPSULE | Freq: Two times a day (BID) | ORAL | 3 refills | Status: DC

## 2021-11-07 MED ORDER — GABAPENTIN 300 MG PO CAPS: ORAL_CAPSULE | ORAL | 3 refills | Status: DC

## 2021-11-07 MED ORDER — AMOXICILLIN-POT CLAVULANATE 875-125 MG PO TABS: 1.0000 | ORAL_TABLET | Freq: Two times a day (BID) | ORAL | 0 refills | Status: DC

## 2021-11-07 NOTE — Patient Instructions (Addendum)
You had labs performed today.  You will be contacted with the results of the labs once they are available, usually in the next 3 business days for routine lab work.  If you have an active my chart account, they will be released to your MyChart.  If you prefer to have these labs released to you via telephone, please let us know. ?  ? ?Bartholin's Cyst ? ?A Bartholin's cyst is a fluid-filled sac that forms on a Bartholin's gland. Bartholin's glands are small glands in the folds of skin near the opening of the vagina (labia). This type of cyst causes a bulge or lump near the opening of the vagina. ?If you have a cyst that is small and not infected, you may be able to take care of it at home. If your cyst gets infected, it may cause pain and your doctor may need to drain it. ?What are the causes? ?This condition may be caused by a blocked Bartholin's gland. Germs (bacteria) inside of the cyst can cause an infection. ?What are the signs or symptoms? ?A bulge or lump near the opening of the vagina. ?Discomfort or pain. ?Redness, swelling, or fluid draining from the area. ?How is this treated? ?You may not need treatment if your cyst is not causing symptoms. The cyst can go away on its own with home care. Home care includes hot baths or heat therapy. ?Large cysts or cysts that are infected may be treated with: ?Antibiotic medicine. ?A procedure to drain the fluid. ?Cysts that keep coming back will need to be drained many times. Your doctor may talk to you about surgery to remove the cyst. ?Follow these instructions at home: ?Medicines ?Take over-the-counter and prescription medicines only as told by your doctor. ?If you were prescribed an antibiotic medicine, take it as told by your doctor. Do not stop taking it even if you start to feel better. ?Managing pain and swelling ?Try sitz baths to help with pain and swelling. A sitz bath is a warm water bath in which the water only comes up to your hips and should cover your  buttocks. You may take sitz baths a few times a day. ?If told, put heat on the affected area as often as needed. Use the heat source that your doctor recommends, such as a moist heat pack or a heating pad. ?Place a towel between your skin and the heat source. ?Leave the heat on for 20-30 minutes. ?Take off the heat if your skin turns bright red. This is very important. If you cannot feel pain, heat, or cold, you have a greater risk of getting burned. ?General instructions ?If your cyst was drained: ?Follow instructions from your doctor about how to take care of your wound. ?Use feminine pads to absorb any fluid. ?Do not push on or squeeze your cyst. ?Do not have sex until the cyst has gone away or your wound from drainage has healed. ?Take these steps to help prevent a cyst from returning, and to prevent other cysts from forming: ?Take a bath or shower once a day. Clean the area around your vagina with mild soap and water when you bathe. ?Practice safe sex to prevent STIs. Talk with your doctor about how to prevent STIs and which forms of birth control to use. ?Keep all follow-up visits. ?Contact a doctor if: ?You have a fever. ?You get more redness, swelling, or pain around your cyst. ?You have fluid, blood, pus, or a bad smell coming from your cyst. ?You  have a cyst that gets larger or a cyst that comes back. ?Summary ?A Bartholin's cyst is a fluid-filled sac that forms on a Bartholin's gland. These small glands are found in the folds of skin near the opening of the vagina (labia). ?This type of cyst causes a bulge or lump near the opening of the vagina. ?Try sitz baths a few times a day to help with pain and swelling. ?Do not push on or squeeze your cyst. ?This information is not intended to replace advice given to you by your health care provider. Make sure you discuss any questions you have with your health care provider. ?Document Revised: 11/17/2019 Document Reviewed: 11/17/2019 ?Elsevier Patient Education ?  [place]. ? ?Tinnitus ?Tinnitus refers to hearing a sound when there is no actual source for that sound. This is often described as ringing in the ears. However, people with this condition may hear a variety of noises, in one ear or in both ears. ?The sounds of tinnitus can be soft, loud, or somewhere in between. Tinnitus can last for a few seconds or can be constant for days. It may go away without treatment and come back at various times. When tinnitus is constant or happens often, it can lead to other problems, such as trouble sleeping and trouble concentrating. ?Almost everyone experiences tinnitus at some point. Tinnitus is not the same as hearing loss. Tinnitus that is long-lasting (chronic) or comes back often (recurs) may require medical attention. ?What are the causes? ?The cause of tinnitus is often not known. In some cases, it can result from: ?Exposure to loud noises from machinery, music, or other sources. ?An object (foreign body) stuck in the ear. ?Earwax buildup. ?Drinking alcohol or caffeine. ?Taking certain medicines. ?Age-related hearing loss. ?It may also be caused by medical conditions such as: ?Ear or sinus infections. ?Heart diseases or high blood pressure. ?Allergies. ?M?ni?re's disease. ?Thyroid problems. ?Tumors. ?A weak, bulging blood vessel (aneurysm) near the ear. ?What increases the risk? ?The following factors may make you more likely to develop this condition: ?Exposure to loud noises. ?Age. Tinnitus is more likely in older individuals. ?Using alcohol or tobacco. ?What are the signs or symptoms? ?The main symptom of tinnitus is hearing a sound when there is no source for that sound. It may sound like: ?Buzzing. ?Sizzling. ?Ringing. ?Blowing air. ?Hissing. ?Whistling. ?Other sounds may include: ?Roaring. ?Running water. ?A musical note. ?Tapping. ?Humming. ?Symptoms may affect only one ear (unilateral) or both ears (bilateral). ?How is this diagnosed? ?Tinnitus is diagnosed  based on your symptoms, your medical history, and a physical exam. Your health care provider may do a thorough hearing test (audiologic exam) if your tinnitus: ?Is unilateral. ?Causes hearing difficulties. ?Lasts 6 months or longer. ?You may work with a health care provider who specializes in hearing disorders (audiologist). You may be asked questions about your symptoms and how they affect your daily life. You may have other tests done, such as: ?CT scan. ?MRI. ?An imaging test of how blood flows through your blood vessels (angiogram). ?How is this treated? ?Treating an underlying medical condition can sometimes make tinnitus go away. If your tinnitus continues, other treatments may include: ?Therapy and counseling to help you manage the stress of living with tinnitus. ?Sound generators to mask the tinnitus. These include: ?Tabletop sound machines that play relaxing sounds to help you fall asleep. ?Wearable devices that fit in your ear and play sounds or music. ?Acoustic neural stimulation. This involves using headphones to listen to  music that contains an auditory signal. Over time, listening to this signal may change some pathways in your brain and make you less sensitive to tinnitus. This treatment is used for very severe cases when no other treatment is working. ?Using hearing aids or cochlear implants if your tinnitus is related to hearing loss. Hearing aids are worn in the outer ear. Cochlear implants are surgically placed in the inner ear. ?Follow these instructions at home: ?Managing symptoms ? ?  ? ?When possible, avoid being in loud places and being exposed to loud sounds. ?Wear hearing protection, such as earplugs, when you are exposed to loud noises. ?Use a white noise machine, a humidifier, or other devices to mask the sound of tinnitus. ?Practice techniques for reducing stress, such as meditation, yoga, or deep breathing. Work with your health care provider if you need help with managing  stress. ?Sleep with your head slightly raised. This may reduce the impact of tinnitus. ?General instructions ?Do not use stimulants, such as nicotine, alcohol, or caffeine. Talk with your health care provider about oth

## 2021-11-07 NOTE — Progress Notes (Signed)
? ?Subjective: ?CC: Vulvar cyst ?PCP: Janora Norlander, DO ?Bridget Gutierrez is a 51 y.o. female presenting to clinic today for: ? ?1.  Vulvar cyst ?Patient has had issues with this in the past.  She reports that she has been having irritation, induration and pain on the left side of the vulva.  She was previously treated by her GYN for this but he has since retired.  She did successfully "pop it" and it has been draining some nonpurulent fluid but she is requesting antibiotics because she thinks she has made it more angry.  No reports of fevers, bleeding.  Pain is actually improved since it started draining ? ?2.  Tinnitus ?Patient reports that she gets roaring in the right ear, specifically at nighttime but occasionally if she bends over.  She treats herself with antihistamines for allergy.  Blood pressure has been well controlled with current regimen ? ?3.  Hypertension, hyperlipidemia ?Patient needs renewal on blood pressure medications and cholesterol medicine.  Denies any chest pain, shortness of breath. ? ? ?ROS: Per HPI ? ?No Known Allergies ?Past Medical History:  ?Diagnosis Date  ? Anxiety   ? B12 deficiency   ? Depression   ? Fatty liver   ? Hyperlipidemia   ? Hypertension   ? Tubular adenoma of colon 2019  ? ? ?Current Outpatient Medications:  ?  amoxicillin-clavulanate (AUGMENTIN) 875-125 MG tablet, Take 1 tablet by mouth 2 (two) times daily., Disp: 20 tablet, Rfl: 0 ?  cetirizine (ZYRTEC) 10 MG tablet, Take 1 tablet by mouth once daily, Disp: 30 tablet, Rfl: 0 ?  cyanocobalamin 500 MCG tablet, Take 500 mcg by mouth daily. , Disp: , Rfl:  ?  ferrous sulfate 325 (65 FE) MG tablet, Take 325 mg by mouth daily with breakfast., Disp: , Rfl:  ?  fluconazole (DIFLUCAN) 150 MG tablet, Take 1 tablet (150 mg total) by mouth once for 1 dose., Disp: 1 tablet, Rfl: 0 ?  KLOR-CON M20 20 MEQ tablet, Take 1 tablet by mouth once daily, Disp: 90 tablet, Rfl: 0 ?  omega-3 acid ethyl esters (LOVAZA) 1 g capsule, Take 2  capsules (2 g total) by mouth 2 (two) times daily., Disp: 360 capsule, Rfl: 3 ?  amLODipine (NORVASC) 5 MG tablet, Take 1 tablet (5 mg total) by mouth daily., Disp: 90 tablet, Rfl: 3 ?  atorvastatin (LIPITOR) 40 MG tablet, Take 1 tablet (40 mg total) by mouth daily., Disp: 90 tablet, Rfl: 3 ?  Bempedoic Acid-Ezetimibe (NEXLIZET) 180-10 MG TABS, Take 1 tablet by mouth daily., Disp: 90 tablet, Rfl: 3 ?  escitalopram (LEXAPRO) 10 MG tablet, Take 1 tablet (10 mg total) by mouth daily., Disp: 90 tablet, Rfl: 3 ?  gabapentin (NEURONTIN) 300 MG capsule, Take 1 capsule each morning and 2 capsules each evening for neuropathy, Disp: 270 capsule, Rfl: 3 ?  hydrochlorothiazide (HYDRODIURIL) 25 MG tablet, Take 1 tablet (25 mg total) by mouth daily., Disp: 90 tablet, Rfl: 3 ?Social History  ? ?Socioeconomic History  ? Marital status: Married  ?  Spouse name: Not on file  ? Number of children: Not on file  ? Years of education: Not on file  ? Highest education level: Not on file  ?Occupational History  ? Not on file  ?Tobacco Use  ? Smoking status: Every Day  ?  Packs/day: 1.50  ?  Years: 15.00  ?  Pack years: 22.50  ?  Types: Cigarettes  ? Smokeless tobacco: Never  ?Vaping Use  ?  Vaping Use: Never used  ?Substance and Sexual Activity  ? Alcohol use: No  ?  Comment: Former 28 cans beer/ week  ? Drug use: No  ? Sexual activity: Not Currently  ?Other Topics Concern  ? Not on file  ?Social History Narrative  ? Not on file  ? ?Social Determinants of Health  ? ?Financial Resource Strain: Low Risk   ? Difficulty of Paying Living Expenses: Not hard at all  ?Food Insecurity: No Food Insecurity  ? Worried About Charity fundraiser in the Last Year: Never true  ? Ran Out of Food in the Last Year: Never true  ?Transportation Needs: No Transportation Needs  ? Lack of Transportation (Medical): No  ? Lack of Transportation (Non-Medical): No  ?Physical Activity: Insufficiently Active  ? Days of Exercise per Week: 1 day  ? Minutes of Exercise per  Session: 30 min  ?Stress: No Stress Concern Present  ? Feeling of Stress : Only a little  ?Social Connections: Moderately Isolated  ? Frequency of Communication with Friends and Family: More than three times a week  ? Frequency of Social Gatherings with Friends and Family: Once a week  ? Attends Religious Services: Never  ? Active Member of Clubs or Organizations: No  ? Attends Archivist Meetings: Never  ? Marital Status: Married  ?Intimate Partner Violence: Not At Risk  ? Fear of Current or Ex-Partner: No  ? Emotionally Abused: No  ? Physically Abused: No  ? Sexually Abused: No  ? ?Family History  ?Problem Relation Age of Onset  ? COPD Mother   ? Hypertension Mother   ? Cancer Mother   ? Hyperlipidemia Mother   ? Cervical cancer Mother   ? Heart disease Mother   ? Heart attack Mother   ? Hypertension Father   ? Cancer Sister   ? Colon cancer Sister 86  ? Testicular cancer Brother   ? Anxiety disorder Daughter   ? ? ?Objective: ?Office vital signs reviewed. ?BP 133/88   Pulse 77   Temp 98.2 ?F (36.8 ?C)   Ht '5\' 4"'  (1.626 m)   Wt 182 lb 12.8 oz (82.9 kg)   LMP 09/17/2017 (Approximate)   SpO2 95%   BMI 31.38 kg/m?  ? ?Physical Examination:  ?General: Awake, alert, well nourished, No acute distress ?HEENT: Sclera white.  Has xanthelasmas on the eyelids ?Cardio: regular rate and rhythm, S1S2 heard, no murmurs appreciated ?Pulm: clear to auscultation bilaterally, no wheezes, rhonchi or rales; normal work of breathing on room air ?GU: Left Bartholin cyst.  Mildly tender.  Fluctuant. ? ?Assessment/ Plan: ?51 y.o. female  ? ?Bartholin's gland abscess - Plan: amoxicillin-clavulanate (AUGMENTIN) 875-125 MG tablet, fluconazole (DIFLUCAN) 150 MG tablet ? ?Tinnitus aurium, right ? ?Essential hypertension - Plan: amLODipine (NORVASC) 5 MG tablet, hydrochlorothiazide (HYDRODIURIL) 25 MG tablet ? ?Fatty liver disease, nonalcoholic - Plan: atorvastatin (LIPITOR) 40 MG tablet, CMP14+EGFR, Lipid Panel, TSH, CBC,  omega-3 acid ethyl esters (LOVAZA) 1 g capsule ? ?Anxiety and depression - Plan: escitalopram (LEXAPRO) 10 MG tablet ? ?Peripheral neuralgia - Plan: gabapentin (NEURONTIN) 300 MG capsule ? ?Mixed hyperlipidemia - Plan: Bempedoic Acid-Ezetimibe (NEXLIZET) 180-10 MG TABS, CMP14+EGFR, Lipid Panel, TSH, omega-3 acid ethyl esters (LOVAZA) 1 g capsule ? ?Xanthelasma of eyelid, bilateral - Plan: omega-3 acid ethyl esters (LOVAZA) 1 g capsule ? ?Augmentin, Diflucan sent.  We discussed that if this does not continue to drain that she should seek reevaluation with her OB/GYN for lancing and removal of this  lesion.  Encourage sitz bath's ? ?Discussed white noise machine for tinnitus.   ? ?Blood pressure well controlled.  Continue current regimen.  Check fasting labs ? ?Anxiety depression are stable with Lexapro.  This has been renewed ? ?Continue Nexlizet, Lovaza ? ?Orders Placed This Encounter  ?Procedures  ? CMP14+EGFR  ? Lipid Panel  ? TSH  ? CBC  ? ?Meds ordered this encounter  ?Medications  ? amLODipine (NORVASC) 5 MG tablet  ?  Sig: Take 1 tablet (5 mg total) by mouth daily.  ?  Dispense:  90 tablet  ?  Refill:  3  ? atorvastatin (LIPITOR) 40 MG tablet  ?  Sig: Take 1 tablet (40 mg total) by mouth daily.  ?  Dispense:  90 tablet  ?  Refill:  3  ? escitalopram (LEXAPRO) 10 MG tablet  ?  Sig: Take 1 tablet (10 mg total) by mouth daily.  ?  Dispense:  90 tablet  ?  Refill:  3  ? gabapentin (NEURONTIN) 300 MG capsule  ?  Sig: Take 1 capsule each morning and 2 capsules each evening for neuropathy  ?  Dispense:  270 capsule  ?  Refill:  3  ? hydrochlorothiazide (HYDRODIURIL) 25 MG tablet  ?  Sig: Take 1 tablet (25 mg total) by mouth daily.  ?  Dispense:  90 tablet  ?  Refill:  3  ? Bempedoic Acid-Ezetimibe (NEXLIZET) 180-10 MG TABS  ?  Sig: Take 1 tablet by mouth daily.  ?  Dispense:  90 tablet  ?  Refill:  3  ? amoxicillin-clavulanate (AUGMENTIN) 875-125 MG tablet  ?  Sig: Take 1 tablet by mouth 2 (two) times daily.  ?   Dispense:  20 tablet  ?  Refill:  0  ? fluconazole (DIFLUCAN) 150 MG tablet  ?  Sig: Take 1 tablet (150 mg total) by mouth once for 1 dose.  ?  Dispense:  1 tablet  ?  Refill:  0  ? ? ? ?Janora Norlander, DO ?[facility]

## 2021-11-08 ENCOUNTER — Other Ambulatory Visit: Payer: Self-pay | Admitting: Family Medicine

## 2021-11-08 DIAGNOSIS — J301 Allergic rhinitis due to pollen: Secondary | ICD-10-CM

## 2021-11-08 LAB — CMP14+EGFR
ALT: 95 IU/L — ABNORMAL HIGH (ref 0–32)
AST: 214 IU/L — ABNORMAL HIGH (ref 0–40)
Albumin/Globulin Ratio: 1.8 (ref 1.2–2.2)
Albumin: 4.8 g/dL (ref 3.8–4.8)
Alkaline Phosphatase: 117 IU/L (ref 44–121)
BUN/Creatinine Ratio: 12 (ref 9–23)
BUN: 9 mg/dL (ref 6–24)
Bilirubin Total: 0.5 mg/dL (ref 0.0–1.2)
CO2: 23 mmol/L (ref 20–29)
Calcium: 10.2 mg/dL (ref 8.7–10.2)
Chloride: 100 mmol/L (ref 96–106)
Creatinine, Ser: 0.73 mg/dL (ref 0.57–1.00)
Globulin, Total: 2.6 g/dL (ref 1.5–4.5)
Glucose: 152 mg/dL — ABNORMAL HIGH (ref 70–99)
Potassium: 3.7 mmol/L (ref 3.5–5.2)
Sodium: 139 mmol/L (ref 134–144)
Total Protein: 7.4 g/dL (ref 6.0–8.5)
eGFR: 100 mL/min/{1.73_m2} (ref >59)

## 2021-11-08 LAB — LIPID PANEL
Chol/HDL Ratio: 9 ratio — ABNORMAL HIGH (ref 0.0–4.4)
Cholesterol, Total: 253 mg/dL — ABNORMAL HIGH (ref 100–199)
HDL: 28 mg/dL — ABNORMAL LOW (ref >39)
LDL Chol Calc (NIH): 183 mg/dL — ABNORMAL HIGH (ref 0–99)
Triglycerides: 218 mg/dL — ABNORMAL HIGH (ref 0–149)
VLDL Cholesterol Cal: 42 mg/dL — ABNORMAL HIGH (ref 5–40)

## 2021-11-08 LAB — CBC
Hematocrit: 41.3 % (ref 34.0–46.6)
Hemoglobin: 14.5 g/dL (ref 11.1–15.9)
MCH: 33.2 pg — ABNORMAL HIGH (ref 26.6–33.0)
MCHC: 35.1 g/dL (ref 31.5–35.7)
MCV: 95 fL (ref 79–97)
Platelets: 417 10*3/uL (ref 150–450)
RBC: 4.37 x10E6/uL (ref 3.77–5.28)
RDW: 12.4 % (ref 11.7–15.4)
WBC: 8.6 10*3/uL (ref 3.4–10.8)

## 2021-11-08 LAB — TSH: TSH: 1.78 u[IU]/mL (ref 0.450–4.500)

## 2021-11-09 LAB — HGB A1C W/O EAG: Hgb A1c MFr Bld: 7.6 % — ABNORMAL HIGH (ref 4.8–5.6)

## 2021-11-09 LAB — SPECIMEN STATUS REPORT

## 2021-11-29 ENCOUNTER — Ambulatory Visit: Payer: Managed Care, Other (non HMO) | Admitting: Family Medicine

## 2022-02-04 ENCOUNTER — Other Ambulatory Visit: Payer: Self-pay | Admitting: Family Medicine

## 2022-02-04 DIAGNOSIS — E876 Hypokalemia: Secondary | ICD-10-CM

## 2022-03-17 ENCOUNTER — Encounter: Payer: Self-pay | Admitting: Family Medicine

## 2022-03-17 ENCOUNTER — Ambulatory Visit (INDEPENDENT_AMBULATORY_CARE_PROVIDER_SITE_OTHER): Payer: Managed Care, Other (non HMO) | Admitting: Family Medicine

## 2022-03-17 ENCOUNTER — Other Ambulatory Visit (HOSPITAL_COMMUNITY)
Admission: RE | Admit: 2022-03-17 | Discharge: 2022-03-17 | Disposition: A | Discharge status: Home / Self Care | Payer: Managed Care, Other (non HMO) | Source: Ambulatory Visit | Attending: Family Medicine | Admitting: Family Medicine

## 2022-03-17 VITALS — BP 112/76 | HR 72 | Temp 98.2°F | Ht 64.0 in | Wt 178.2 lb

## 2022-03-17 DIAGNOSIS — J301 Allergic rhinitis due to pollen: Secondary | ICD-10-CM

## 2022-03-17 DIAGNOSIS — E876 Hypokalemia: Secondary | ICD-10-CM

## 2022-03-17 DIAGNOSIS — E1169 Type 2 diabetes mellitus with other specified complication: Secondary | ICD-10-CM

## 2022-03-17 DIAGNOSIS — E1159 Type 2 diabetes mellitus with other circulatory complications: Secondary | ICD-10-CM

## 2022-03-17 DIAGNOSIS — Z124 Encounter for screening for malignant neoplasm of cervix: Secondary | ICD-10-CM | POA: Diagnosis present

## 2022-03-17 DIAGNOSIS — I152 Hypertension secondary to endocrine disorders: Secondary | ICD-10-CM

## 2022-03-17 DIAGNOSIS — E119 Type 2 diabetes mellitus without complications: Secondary | ICD-10-CM | POA: Diagnosis not present

## 2022-03-17 DIAGNOSIS — E785 Hyperlipidemia, unspecified: Secondary | ICD-10-CM

## 2022-03-17 DIAGNOSIS — Z0001 Encounter for general adult medical examination with abnormal findings: Secondary | ICD-10-CM | POA: Diagnosis not present

## 2022-03-17 DIAGNOSIS — Z0000 Encounter for general adult medical examination without abnormal findings: Secondary | ICD-10-CM

## 2022-03-17 MED ORDER — CETIRIZINE HCL 10 MG PO TABS: 10.0000 mg | ORAL_TABLET | Freq: Every day | ORAL | 3 refills | Status: DC

## 2022-03-17 MED ORDER — POTASSIUM CHLORIDE CRYS ER 20 MEQ PO TBCR: 20.0000 meq | EXTENDED_RELEASE_TABLET | Freq: Every day | ORAL | 3 refills | Status: DC

## 2022-03-17 NOTE — Progress Notes (Signed)
Bridget Gutierrez is a 51 y.o. female presents to office today for annual physical exam examination.    Concerns today include: 1. none  Marital status: married  Diet: typical [place], Exercise: no structured Last eye exam: needs Last colonoscopy: UTD Last mammogram: UTD Last pap smear: needs Refills needed today: zyrtec Immunizations needed: Immunization History  Administered Date(s) Administered   Moderna Sars-Covid-2 Vaccination 10/27/2019, 11/24/2019, 08/11/2020     Past Medical History:  Diagnosis Date   Anxiety    B12 deficiency    Depression    Fatty liver    Hyperlipidemia    Hypertension    Tubular adenoma of colon 2019   Social History   Socioeconomic History   Marital status: Married    Spouse name: Not on file   Number of children: Not on file   Years of education: Not on file   Highest education level: Not on file  Occupational History   Not on file  Tobacco Use   Smoking status: Every Day    Packs/day: 1.50    Years: 15.00    Total pack years: 22.50    Types: Cigarettes   Smokeless tobacco: Never  Vaping Use   Vaping Use: Never used  Substance and Sexual Activity   Alcohol use: No    Comment: Former 28 cans beer/ week   Drug use: No   Sexual activity: Not Currently  Other Topics Concern   Not on file  Social History Narrative   Not on file   Social Determinants of Health   Financial Resource Strain: Low Risk  (11/15/2020)   Overall Financial Resource Strain (CARDIA)    Difficulty of Paying Living Expenses: Not hard at all  Food Insecurity: No Food Insecurity (11/15/2020)   Hunger Vital Sign    Worried About Running Out of Food in the Last Year: Never true    Ran Out of Food in the Last Year: Never true  Transportation Needs: No Transportation Needs (11/15/2020)   PRAPARE - Administrator, Civil Service (Medical): No    Lack of Transportation (Non-Medical): No  Physical Activity: Insufficiently Active (11/15/2020)   Exercise  Vital Sign    Days of Exercise per Week: 1 day    Minutes of Exercise per Session: 30 min  Stress: No Stress Concern Present (11/15/2020)   [employer] of Occupational Health - Occupational Stress Questionnaire    Feeling of Stress : Only a little  Social Connections: Moderately Isolated (11/15/2020)   Social Connection and Isolation Panel [NHANES]    Frequency of Communication with Friends and Family: More than three times a week    Frequency of Social Gatherings with Friends and Family: Once a week    Attends Religious Services: Never    Database administrator or Organizations: No    Attends Banker Meetings: Never    Marital Status: Married  Catering manager Violence: Not At Risk (11/15/2020)   Humiliation, Afraid, Rape, and Kick questionnaire    Fear of Current or Ex-Partner: No    Emotionally Abused: No    Physically Abused: No    Sexually Abused: No   Past Surgical History:  Procedure Laterality Date   CESAREAN SECTION  2000   COLONOSCOPY WITH PROPOFOL N/A 09/27/2017   Procedure: COLONOSCOPY WITH PROPOFOL;  Surgeon: Wyline Mood, MD;  Location: [facility] ENDOSCOPY;  Service: Gastroenterology;  Laterality: N/A;   TONSILLECTOMY     Family History  Problem Relation Age of Onset  COPD Mother    Hypertension Mother    Cancer Mother    Hyperlipidemia Mother    Cervical cancer Mother    Heart disease Mother    Heart attack Mother    Hypertension Father    Cancer Sister    Colon cancer Sister 11   Testicular cancer Brother    Anxiety disorder Daughter     Current Outpatient Medications:    amLODipine (NORVASC) 5 MG tablet, Take 1 tablet (5 mg total) by mouth daily., Disp: 90 tablet, Rfl: 3   atorvastatin (LIPITOR) 40 MG tablet, Take 1 tablet (40 mg total) by mouth daily., Disp: 90 tablet, Rfl: 3   Bempedoic Acid-Ezetimibe (NEXLIZET) 180-10 MG TABS, Take 1 tablet by mouth daily., Disp: 90 tablet, Rfl: 3   cyanocobalamin 500 MCG tablet, Take 500 mcg by mouth  daily. , Disp: , Rfl:    escitalopram (LEXAPRO) 10 MG tablet, Take 1 tablet (10 mg total) by mouth daily., Disp: 90 tablet, Rfl: 3   ferrous sulfate 325 (65 FE) MG tablet, Take 325 mg by mouth daily with breakfast., Disp: , Rfl:    gabapentin (NEURONTIN) 300 MG capsule, Take 1 capsule each morning and 2 capsules each evening for neuropathy, Disp: 270 capsule, Rfl: 3   hydrochlorothiazide (HYDRODIURIL) 25 MG tablet, Take 1 tablet (25 mg total) by mouth daily., Disp: 90 tablet, Rfl: 3   omega-3 acid ethyl esters (LOVAZA) 1 g capsule, Take 2 capsules (2 g total) by mouth 2 (two) times daily., Disp: 360 capsule, Rfl: 3   potassium chloride SA (KLOR-CON M) 20 MEQ tablet, Take 1 tablet by mouth once daily, Disp: 90 tablet, Rfl: 0  No Known Allergies   ROS: Review of Systems Pertinent items noted in HPI and remainder of comprehensive ROS otherwise negative.    Physical exam BP 112/76   Pulse 72   Temp 98.2 F (36.8 C)   Ht $R'5\' 4"'fX$  (1.626 m)   Wt 178 lb 3.2 oz (80.8 kg)   LMP 09/17/2017 (Approximate)   SpO2 96%   BMI 30.59 kg/m  General appearance: alert, cooperative, appears stated age, and no distress Head: Normocephalic, without obvious abnormality, atraumatic Eyes: negative findings: conjunctivae and sclerae normal, corneas clear, pupils equal, round, reactive to light and accomodation, and xanthelasmas present in bilateral eyelids Ears: normal TM's and external ear canals both ears Nose: Nares normal. Septum midline. Mucosa normal. No drainage or sinus tenderness. Throat: lips, mucosa, and tongue normal; teeth and gums normal Neck: no adenopathy, supple, symmetrical, trachea midline, and thyroid not enlarged, symmetric, no tenderness/mass/nodules Back: symmetric, no curvature. ROM normal. No CVA tenderness. Lungs: clear to auscultation bilaterally Heart: regular rate and rhythm, S1, S2 normal, no murmur, click, rub or gallop Abdomen: soft, non-tender; bowel sounds normal; no masses,  no  organomegaly Pelvic: cervix normal in appearance, external genitalia normal, no adnexal masses or tenderness, no cervical motion tenderness, rectovaginal septum normal, uterus normal size, shape, and consistency, and vagina normal without discharge Extremities: extremities normal, atraumatic, no cyanosis or edema Pulses: 2+ and symmetric Skin: Skin color, texture, turgor normal. No rashes or lesions Lymph nodes: Cervical, supraclavicular, and axillary nodes normal. Neurologic: Grossly normal Diabetic Foot Exam - Simple   Simple Foot Form Diabetic Foot exam was performed with the following findings: Yes 03/17/2022  4:41 PM  Visual Inspection No deformities, no ulcerations, no other skin breakdown bilaterally: Yes Sensation Testing Intact to touch and monofilament testing bilaterally: Yes Pulse Check Posterior Tibialis and Dorsalis pulse intact  bilaterally: Yes Comments        Assessment/ Plan: Athena Masse here for annual physical exam.   Annual physical exam  Screening for malignant neoplasm of cervix - Plan: Cytology - PAP([place]), CANCELED: Cytology - PAP([place])  New onset type 2 diabetes mellitus ([place]) - Plan: Hemoglobin A1c  Hyperlipidemia associated with type 2 diabetes mellitus ([place]) - Plan: Lipid Panel, CMP14+EGFR  Hypertension associated with diabetes ([place]) - Plan: CMP14+EGFR  Allergic rhinitis due to pollen, unspecified seasonality - Plan: cetirizine (ZYRTEC) 10 MG tablet  Hypokalemia - Plan: potassium chloride SA (KLOR-CON M) 20 MEQ tablet, CMP14+EGFR  Pap completed.  Foot exam completed.  Encouraged to get diabetic eye exam.  We will plan for urine microalbumin at next visit  Blood pressure is controlled.  Check fasting lipid.  Zyrtec renewed  Potassium reviewed  Counseled on healthy lifestyle choices, including diet (rich in fruits, vegetables and lean meats and low in salt and simple carbohydrates) and exercise (at least 30 minutes of moderate  physical activity daily).  Patient to follow up in 25mfor DM/ urine micro  Linnell Swords M. GLajuana Ripple DO

## 2022-03-17 NOTE — Patient Instructions (Signed)
You had labs performed today.  You will be contacted with the results of the labs once they are available, usually in the next 3 business days for routine lab work.  If you have an active my chart account, they will be released to your MyChart.  If you prefer to have these labs released to you via telephone, please let us know.   Get diabetic eye exam done. We can do the retinal exam here with [facility] if needed, just give [place] a call   Diabetes Mellitus and Standards of [place] with and managing diabetes (diabetes mellitus) can be complicated. Your diabetes treatment may be managed by a team of health care providers, including: A physician who specializes in diabetes (endocrinologist). You might also have visits with a nurse practitioner or physician assistant. Nurses. A registered dietitian. A certified diabetes care and education specialist. An exercise specialist. A pharmacist. An eye doctor. A foot specialist (podiatrist). A dental care provider. A primary care provider. A mental health care provider. How to manage your diabetes You can do many things to successfully manage your diabetes. Your health care providers will follow guidelines to help you get the best quality of care. Here are general guidelines for your diabetes management plan. Your health care providers may give you more specific instructions. Physical exams When you are diagnosed with diabetes, and each year after that, your health care provider will ask about your medical and family history. You will have a physical exam, which may include: Measuring your height, weight, and body mass index (BMI). Checking your blood pressure. This will be done at every routine medical visit. Your target blood pressure may vary depending on your medical conditions, your age, and other factors. A thyroid exam. A skin exam. Screening for nerve damage (peripheral neuropathy). This may include checking the pulse in your legs and feet  and the level of sensation in your hands and feet. A foot exam to inspect the structure and skin of your feet, including checking for cuts, bruises, redness, blisters, sores, or other problems. Screening for blood vessel (vascular) problems. This may include checking the pulse in your legs and feet and checking your temperature. Blood tests Depending on your treatment plan and your personal needs, you may have the following tests: Hemoglobin A1C (HbA1C). This test provides information about blood sugar (glucose) control over the previous 2-3 months. It is used to adjust your treatment plan, if needed. This test will be done: At least 2 times a year, if you are meeting your treatment goals. 4 times a year, if you are not meeting your treatment goals or if your goals have changed. Lipid testing, including total cholesterol, LDL and HDL cholesterol, and triglyceride levels. The goal for LDL is less than 100 mg/dL (5.5 mmol/L). If you are at high risk for complications, the goal is less than 70 mg/dL (3.9 mmol/L). The goal for HDL is 40 mg/dL (2.2 mmol/L) or higher for men, and 50 mg/dL (2.8 mmol/L) or higher for women. An HDL cholesterol of 60 mg/dL (3.3 mmol/L) or higher gives some protection against heart disease. The goal for triglycerides is less than 150 mg/dL (8.3 mmol/L). Liver function tests. Kidney function tests. Thyroid function tests.  Dental and eye exams  Visit your dentist two times a year. If you have type 1 diabetes, your health care provider may recommend an eye exam within 5 years after you are diagnosed, and then once a year after your first exam. For children with  type 1 diabetes, the health care provider may recommend an eye exam when your child is age 46 or older and has had diabetes for 3-5 years. After the first exam, your child should get an eye exam once a year. If you have type 2 diabetes, your health care provider may recommend an eye exam as soon as you are diagnosed,  and then every 1-2 years after your first exam. Immunizations A yearly flu (influenza) vaccine is recommended annually for everyone 6 months or older. This is especially important if you have diabetes. The pneumonia (pneumococcal) vaccine is recommended for everyone 2 years or older who has diabetes. If you are age 59 or older, you may get the pneumonia vaccine as a series of two separate shots. The hepatitis B vaccine is recommended for adults shortly after being diagnosed with diabetes. Adults and children with diabetes should receive all other vaccines according to age-specific recommendations from the Centers for Disease Control and Prevention (CDC). Mental and emotional health Screening for symptoms of eating disorders, anxiety, and depression is recommended at the time of diagnosis and after as needed. If your screening shows that you have symptoms, you may need more evaluation. You may work with a mental health care provider. Follow these instructions at home: Treatment plan You will monitor your blood glucose levels and may give yourself insulin. Your treatment plan will be reviewed at every medical visit. You and your health care provider will discuss: How you are taking your medicines, including insulin. Any side effects you have. Your blood glucose level target goals. How often you monitor your blood glucose level. Lifestyle habits, such as activity level and tobacco, alcohol, and substance use. Education Your health care provider will assess how well you are monitoring your blood glucose levels and whether you are taking your insulin and medicines correctly. He or she may refer you to: A certified diabetes care and education specialist to manage your diabetes throughout your life, starting at diagnosis. A registered dietitian who can create and review your personal nutrition plan. An exercise specialist who can discuss your activity level and exercise plan. General instructions Take  over-the-counter and prescription medicines only as told by your health care provider. Keep all follow-up visits. This is important. Where to find support There are many diabetes support networks, including: American Diabetes Association (ADA): diabetes.org Defeat Diabetes Foundation: defeatdiabetes.org Where to find more information American Diabetes Association (ADA): www.diabetes.org Association of Diabetes Care & Education Specialists (ADCES): diabeteseducator.org International Diabetes Federation (IDF): https://www.munoz-bell.org/ Summary Managing diabetes (diabetes mellitus) can be complicated. Your diabetes treatment may be managed by a team of health care providers. Your health care providers follow guidelines to help you get the best quality care. You should have physical exams, blood tests, blood pressure monitoring, immunizations, and screening tests regularly. Stay updated on how to manage your diabetes. Your health care providers may also give you more specific instructions based on your individual health. This information is not intended to replace advice given to you by your health care provider. Make sure you discuss any questions you have with your health care provider. Document Revised: 12/25/2019 Document Reviewed: 12/25/2019 Elsevier Patient Education  [place].

## 2022-03-18 LAB — LIPID PANEL
Chol/HDL Ratio: 8.1 ratio — ABNORMAL HIGH (ref 0.0–4.4)
Cholesterol, Total: 203 mg/dL — ABNORMAL HIGH (ref 100–199)
HDL: 25 mg/dL — ABNORMAL LOW (ref >39)
LDL Chol Calc (NIH): 124 mg/dL — ABNORMAL HIGH (ref 0–99)
Triglycerides: 302 mg/dL — ABNORMAL HIGH (ref 0–149)
VLDL Cholesterol Cal: 54 mg/dL — ABNORMAL HIGH (ref 5–40)

## 2022-03-22 LAB — CYTOLOGY - PAP
Comment: NEGATIVE
Diagnosis: NEGATIVE
High risk HPV: NEGATIVE

## 2022-04-20 ENCOUNTER — Telehealth: Payer: Self-pay | Admitting: Pharmacist

## 2022-04-20 ENCOUNTER — Ambulatory Visit (INDEPENDENT_AMBULATORY_CARE_PROVIDER_SITE_OTHER): Payer: Managed Care, Other (non HMO) | Admitting: Pharmacist

## 2022-04-20 DIAGNOSIS — E1169 Type 2 diabetes mellitus with other specified complication: Secondary | ICD-10-CM

## 2022-04-20 DIAGNOSIS — E785 Hyperlipidemia, unspecified: Secondary | ICD-10-CM | POA: Diagnosis not present

## 2022-04-20 DIAGNOSIS — E119 Type 2 diabetes mellitus without complications: Secondary | ICD-10-CM | POA: Diagnosis not present

## 2022-04-20 LAB — BAYER DCA HB A1C WAIVED: HB A1C (BAYER DCA - WAIVED): 7.2 % — ABNORMAL HIGH (ref 4.8–5.6)

## 2022-04-20 MED ORDER — REPATHA SURECLICK 140 MG/ML ~~LOC~~ SOAJ: 140.0000 mg | SUBCUTANEOUS | 5 refills | Status: DC

## 2022-04-20 NOTE — Progress Notes (Signed)
    04/20/2022 Name: Bridget Gutierrez MRN: [id number] DOB: 12-13-1970   S:  Bridget Gutierrez is a 51 y.o. female patient referred to lipid clinic by PCP. Patient was last seen by Pharmd in 2021 when her LDL was >200.  Her LDL has vastly improved, however there is still room for improvement as we strive for cardiac risk reduction.  PMH is significant for:   Past Medical History:  Diagnosis Date   Anxiety    B12 deficiency    Depression    Fatty liver    Hyperlipidemia    Hypertension    Tubular adenoma of colon 2019    Current Medications: Current Outpatient Medications on File Prior to Visit  Medication Sig Dispense Refill   amLODipine (NORVASC) 5 MG tablet Take 1 tablet (5 mg total) by mouth daily. 90 tablet 3   atorvastatin (LIPITOR) 40 MG tablet Take 1 tablet (40 mg total) by mouth daily. 90 tablet 3   Bempedoic Acid-Ezetimibe (NEXLIZET) 180-10 MG TABS Take 1 tablet by mouth daily. 90 tablet 3   cetirizine (ZYRTEC) 10 MG tablet Take 1 tablet (10 mg total) by mouth daily. 90 tablet 3   cyanocobalamin 500 MCG tablet Take 500 mcg by mouth daily.      escitalopram (LEXAPRO) 10 MG tablet Take 1 tablet (10 mg total) by mouth daily. 90 tablet 3   ferrous sulfate 325 (65 FE) MG tablet Take 325 mg by mouth daily with breakfast.     gabapentin (NEURONTIN) 300 MG capsule Take 1 capsule each morning and 2 capsules each evening for neuropathy 270 capsule 3   hydrochlorothiazide (HYDRODIURIL) 25 MG tablet Take 1 tablet (25 mg total) by mouth daily. 90 tablet 3   omega-3 acid ethyl esters (LOVAZA) 1 g capsule Take 2 capsules (2 g total) by mouth 2 (two) times daily. 360 capsule 3   potassium chloride SA (KLOR-CON M) 20 MEQ tablet Take 1 tablet (20 mEq total) by mouth daily. 90 tablet 3   No current facility-administered medications on file prior to visit.   Intolerances: N/A  Risk Factors: DIABETES, HTN, FAMILY HISTORY  LDL goal:  <70 given h/o diabetes  Diet: RECOMMEND MEDITERRANEAN DIET;  HEALTHY PLATE METHOD/HEART HEALTHY DIET  Exercise: ENCOURAGED  Family History: FAMILIAL HLD  Social History:   Labs:   Lipid Panel  Assessment/Plan:    1. Hyperlipidemia - Continue lovaza Continue statin Discontinue nexlizet START REPATHA-PA COMPLETED/approved  Copay card website not working  Samples given--patient to start on Sunday 10/22 Check a1c/CMP --didn't get drawn at last visit in september  -Will likely start [place] for t2dm and fatty liver at f/u   Regina Eck, PharmD, BCPS Clinical Pharmacist, [place]  II Phone [phone]

## 2022-04-21 LAB — CMP14+EGFR
ALT: 62 IU/L — ABNORMAL HIGH (ref 0–32)
AST: 59 IU/L — ABNORMAL HIGH (ref 0–40)
Albumin/Globulin Ratio: 1.9 (ref 1.2–2.2)
Albumin: 4.8 g/dL (ref 3.8–4.9)
Alkaline Phosphatase: 149 IU/L — ABNORMAL HIGH (ref 44–121)
BUN/Creatinine Ratio: 12 (ref 9–23)
BUN: 8 mg/dL (ref 6–24)
Bilirubin Total: 0.4 mg/dL (ref 0.0–1.2)
CO2: 25 mmol/L (ref 20–29)
Calcium: 10.1 mg/dL (ref 8.7–10.2)
Chloride: 99 mmol/L (ref 96–106)
Creatinine, Ser: 0.65 mg/dL (ref 0.57–1.00)
Globulin, Total: 2.5 g/dL (ref 1.5–4.5)
Glucose: 148 mg/dL — ABNORMAL HIGH (ref 70–99)
Potassium: 3.6 mmol/L (ref 3.5–5.2)
Sodium: 138 mmol/L (ref 134–144)
Total Protein: 7.3 g/dL (ref 6.0–8.5)
eGFR: 107 mL/min/{1.73_m2} (ref >59)

## 2022-05-05 NOTE — Telephone Encounter (Signed)
Repatha-Approved;Review Type:Prior Auth;Coverage Start Date:04/20/2022;Coverage End Date:04/20/2023;  Pharmacy aware.

## 2022-05-11 ENCOUNTER — Ambulatory Visit (INDEPENDENT_AMBULATORY_CARE_PROVIDER_SITE_OTHER): Payer: Managed Care, Other (non HMO) | Admitting: Pharmacist

## 2022-05-11 DIAGNOSIS — K760 Fatty (change of) liver, not elsewhere classified: Secondary | ICD-10-CM

## 2022-05-11 DIAGNOSIS — E119 Type 2 diabetes mellitus without complications: Secondary | ICD-10-CM

## 2022-05-11 NOTE — Progress Notes (Signed)
      05/11/2022 Name: KRISTIAN HAZZARD    MRN: [id number]       DOB: 08/20/1970     S:  KEYLEIGH MANNINEN is a 51 y.o. female patient referred to PharmD clinic by PCP. Patient was last seen by PharmD last month and was started on Repatha.  She is tolerating well.  The goal of this visit is to review diabetes management strategies.        Past Medical History:  Diagnosis Date   Anxiety     B12 deficiency     Depression     Fatty liver     Hyperlipidemia     Hypertension     Tubular adenoma of colon 2019      Current Medications:       Current Outpatient Medications on File Prior to Visit  Medication Sig Dispense Refill   amLODipine (NORVASC) 5 MG tablet Take 1 tablet (5 mg total) by mouth daily. 90 tablet 3   atorvastatin (LIPITOR) 40 MG tablet Take 1 tablet (40 mg total) by mouth daily. 90 tablet 3   Bempedoic Acid-Ezetimibe (NEXLIZET) 180-10 MG TABS Take 1 tablet by mouth daily. 90 tablet 3   cetirizine (ZYRTEC) 10 MG tablet Take 1 tablet (10 mg total) by mouth daily. 90 tablet 3   cyanocobalamin 500 MCG tablet Take 500 mcg by mouth daily.        escitalopram (LEXAPRO) 10 MG tablet Take 1 tablet (10 mg total) by mouth daily. 90 tablet 3   ferrous sulfate 325 (65 FE) MG tablet Take 325 mg by mouth daily with breakfast.       gabapentin (NEURONTIN) 300 MG capsule Take 1 capsule each morning and 2 capsules each evening for neuropathy 270 capsule 3   hydrochlorothiazide (HYDRODIURIL) 25 MG tablet Take 1 tablet (25 mg total) by mouth daily. 90 tablet 3   omega-3 acid ethyl esters (LOVAZA) 1 g capsule Take 2 capsules (2 g total) by mouth 2 (two) times daily. 360 capsule 3   potassium chloride SA (KLOR-CON M) 20 MEQ tablet Take 1 tablet (20 mEq total) by mouth daily. 90 tablet 3    No current facility-administered medications on file prior to visit.    Intolerances: N/A   Risk Factors: DIABETES, HTN, FAMILY HISTORY   LDL goal:  <70 given h/o diabetes   Diet: RECOMMEND MEDITERRANEAN DIET;  HEALTHY PLATE METHOD/HEART HEALTHY DIET   Exercise: ENCOURAGED   Family History: FAMILIAL HLD   Social History:   Labs:     Lipid Panel  Assessment/Plan:     1. Hyperlipidemia - Continue lovaza Continue statin Continue REPATHA-PA COMPLETED/approved             Copay card emailed securely to patient ($5/month) T2DM -START Ozempic for t2dm and fatty liver  Denies personal and family history of Medullary thyroid cancer (MTC)  Sample given--0.'25mg'$  sq weekly for 4 weeks, will increase to 0.'5mg'$  weekly as tolerated  F/u with pharmd in 4 weeks     Regina Eck, PharmD, BCPS Clinical Pharmacist, [place]  II Phone [phone]

## 2022-06-07 ENCOUNTER — Encounter: Payer: Self-pay | Admitting: Family Medicine

## 2022-06-07 ENCOUNTER — Ambulatory Visit (INDEPENDENT_AMBULATORY_CARE_PROVIDER_SITE_OTHER): Payer: Managed Care, Other (non HMO) | Admitting: Family Medicine

## 2022-06-07 VITALS — BP 131/88 | HR 71 | Temp 97.6°F | Ht 64.0 in | Wt 178.4 lb

## 2022-06-07 DIAGNOSIS — E119 Type 2 diabetes mellitus without complications: Secondary | ICD-10-CM

## 2022-06-07 DIAGNOSIS — M792 Neuralgia and neuritis, unspecified: Secondary | ICD-10-CM | POA: Diagnosis not present

## 2022-06-07 DIAGNOSIS — F419 Anxiety disorder, unspecified: Secondary | ICD-10-CM

## 2022-06-07 DIAGNOSIS — E876 Hypokalemia: Secondary | ICD-10-CM

## 2022-06-07 DIAGNOSIS — I152 Hypertension secondary to endocrine disorders: Secondary | ICD-10-CM

## 2022-06-07 DIAGNOSIS — E1169 Type 2 diabetes mellitus with other specified complication: Secondary | ICD-10-CM

## 2022-06-07 DIAGNOSIS — E1159 Type 2 diabetes mellitus with other circulatory complications: Secondary | ICD-10-CM

## 2022-06-07 DIAGNOSIS — H0263 Xanthelasma of right eye, unspecified eyelid: Secondary | ICD-10-CM

## 2022-06-07 DIAGNOSIS — E785 Hyperlipidemia, unspecified: Secondary | ICD-10-CM

## 2022-06-07 DIAGNOSIS — F32A Depression, unspecified: Secondary | ICD-10-CM

## 2022-06-07 DIAGNOSIS — K760 Fatty (change of) liver, not elsewhere classified: Secondary | ICD-10-CM

## 2022-06-07 DIAGNOSIS — H0266 Xanthelasma of left eye, unspecified eyelid: Secondary | ICD-10-CM

## 2022-06-07 DIAGNOSIS — J301 Allergic rhinitis due to pollen: Secondary | ICD-10-CM

## 2022-06-07 MED ORDER — ATORVASTATIN CALCIUM 40 MG PO TABS: 40.0000 mg | ORAL_TABLET | Freq: Every day | ORAL | 3 refills | Status: DC

## 2022-06-07 MED ORDER — ESCITALOPRAM OXALATE 10 MG PO TABS: 10.0000 mg | ORAL_TABLET | Freq: Every day | ORAL | 3 refills | Status: DC

## 2022-06-07 MED ORDER — OMEGA-3-ACID ETHYL ESTERS 1 G PO CAPS: 2.0000 | ORAL_CAPSULE | Freq: Two times a day (BID) | ORAL | 3 refills | Status: DC

## 2022-06-07 MED ORDER — CETIRIZINE HCL 10 MG PO TABS: 10.0000 mg | ORAL_TABLET | Freq: Every day | ORAL | 3 refills | Status: DC

## 2022-06-07 MED ORDER — POTASSIUM CHLORIDE CRYS ER 20 MEQ PO TBCR: 20.0000 meq | EXTENDED_RELEASE_TABLET | Freq: Every day | ORAL | 3 refills | Status: DC

## 2022-06-07 MED ORDER — AMLODIPINE BESYLATE 5 MG PO TABS: 5.0000 mg | ORAL_TABLET | Freq: Every day | ORAL | 3 refills | Status: DC

## 2022-06-07 MED ORDER — HYDROCHLOROTHIAZIDE 25 MG PO TABS: 25.0000 mg | ORAL_TABLET | Freq: Every day | ORAL | 3 refills | Status: DC

## 2022-06-07 MED ORDER — GABAPENTIN 300 MG PO CAPS: ORAL_CAPSULE | ORAL | 3 refills | Status: DC

## 2022-06-07 NOTE — Progress Notes (Signed)
Subjective: CC: Diabetes PCP: Janora Norlander, DO Bridget Gutierrez is a 51 y.o. female presenting to clinic today for:  1. Type 2 Diabetes with hypertension, hyperlipidemia:  Patient with new onset diabetes.  She is currently injecting Ozempic 0.25 mg weekly.  Plans to go up to 0.5 mg soon.  Denies any abdominal pain, nausea, vomiting.  She does report a decreased appetite.  Compliant with Norvasc, Lipitor, Lexapro, gabapentin, hydrochlorothiazide, Lovaza and Klor-Con  Last eye exam: will schedule Last foot exam: UTD Last A1c:  Lab Results  Component Value Date   HGBA1C 7.2 (H) 04/20/2022   Nephropathy screen indicated?: UTD Last flu, zoster and/or pneumovax:  Immunization History  Administered Date(s) Administered   Moderna Sars-Covid-2 Vaccination 10/27/2019, 11/24/2019, 08/11/2020    ROS: Denies dizziness, LOC, polyuria, polydipsia, unintended weight loss/gain, foot ulcerations, numbness or tingling in extremities, shortness of breath or chest pain.    ROS: Per HPI  No Known Allergies Past Medical History:  Diagnosis Date   Anxiety    B12 deficiency    Depression    Fatty liver    Hyperlipidemia    Hypertension    Tubular adenoma of colon 2019    Current Outpatient Medications:    amLODipine (NORVASC) 5 MG tablet, Take 1 tablet (5 mg total) by mouth daily., Disp: 90 tablet, Rfl: 3   atorvastatin (LIPITOR) 40 MG tablet, Take 1 tablet (40 mg total) by mouth daily., Disp: 90 tablet, Rfl: 3   cetirizine (ZYRTEC) 10 MG tablet, Take 1 tablet (10 mg total) by mouth daily., Disp: 90 tablet, Rfl: 3   cyanocobalamin 500 MCG tablet, Take 500 mcg by mouth daily. , Disp: , Rfl:    escitalopram (LEXAPRO) 10 MG tablet, Take 1 tablet (10 mg total) by mouth daily., Disp: 90 tablet, Rfl: 3   Evolocumab (REPATHA SURECLICK) 712 MG/ML SOAJ, Inject 140 mg into the skin every 14 (fourteen) days., Disp: 2 mL, Rfl: 5   ferrous sulfate 325 (65 FE) MG tablet, Take 325 mg by mouth daily  with breakfast., Disp: , Rfl:    gabapentin (NEURONTIN) 300 MG capsule, Take 1 capsule each morning and 2 capsules each evening for neuropathy, Disp: 270 capsule, Rfl: 3   hydrochlorothiazide (HYDRODIURIL) 25 MG tablet, Take 1 tablet (25 mg total) by mouth daily., Disp: 90 tablet, Rfl: 3   omega-3 acid ethyl esters (LOVAZA) 1 g capsule, Take 2 capsules (2 g total) by mouth 2 (two) times daily., Disp: 360 capsule, Rfl: 3   potassium chloride SA (KLOR-CON M) 20 MEQ tablet, Take 1 tablet (20 mEq total) by mouth daily., Disp: 90 tablet, Rfl: 3   Semaglutide,0.25 or 0.5MG/DOS, (OZEMPIC, 0.25 OR 0.5 MG/DOSE,) 2 MG/1.5ML SOPN, Inject into the skin., Disp: , Rfl:  Social History   Socioeconomic History   Marital status: Married    Spouse name: Not on file   Number of children: Not on file   Years of education: Not on file   Highest education level: Not on file  Occupational History   Not on file  Tobacco Use   Smoking status: Every Day    Packs/day: 1.50    Years: 15.00    Total pack years: 22.50    Types: Cigarettes   Smokeless tobacco: Never  Vaping Use   Vaping Use: Never used  Substance and Sexual Activity   Alcohol use: No    Comment: Former 28 cans beer/ week   Drug use: No   Sexual activity: Not Currently  Other Topics Concern   Not on file  Social History Narrative   Not on file   Social Determinants of Health   Financial Resource Strain: Low Risk  (11/15/2020)   Overall Financial Resource Strain (CARDIA)    Difficulty of Paying Living Expenses: Not hard at all  Food Insecurity: No Food Insecurity (11/15/2020)   Hunger Vital Sign    Worried About Running Out of Food in the Last Year: Never true    [place] in the Last Year: Never true  Transportation Needs: No Transportation Needs (11/15/2020)   PRAPARE - Hydrologist (Medical): No    Lack of Transportation (Non-Medical): No  Physical Activity: Insufficiently Active (11/15/2020)    Exercise Vital Sign    Days of Exercise per Week: 1 day    Minutes of Exercise per Session: 30 min  Stress: No Stress Concern Present (11/15/2020)   [place]    Feeling of Stress : Only a little  Social Connections: Moderately Isolated (11/15/2020)   Social Connection and Isolation Panel [NHANES]    Frequency of Communication with Friends and Family: More than three times a week    Frequency of Social Gatherings with Friends and Family: Once a week    Attends Religious Services: Never    Marine scientist or Organizations: No    Attends Archivist Meetings: Never    Marital Status: Married  Human resources officer Violence: Not At Risk (11/15/2020)   Humiliation, Afraid, Rape, and Kick questionnaire    Fear of Current or Ex-Partner: No    Emotionally Abused: No    Physically Abused: No    Sexually Abused: No   Family History  Problem Relation Age of Onset   COPD Mother    Hypertension Mother    Cancer Mother    Hyperlipidemia Mother    Cervical cancer Mother    Heart disease Mother    Heart attack Mother    Hypertension Father    Cancer Sister    Colon cancer Sister 48   Testicular cancer Brother    Anxiety disorder Daughter     Objective: Office vital signs reviewed. BP 131/88   Pulse 71   Temp 97.6 F (36.4 C)   Ht _0  (1.626 m)   Wt 178 lb 6.4 oz (80.9 kg)   LMP 09/17/2017 (Approximate)   SpO2 96%   BMI 30.62 kg/m   Physical Examination:  General: Awake, alert, well nourished, No acute distress HEENT: Xanthelasma noted at the eyelids.  Sclera white.  Moist mucous membranes Cardio: regular rate and rhythm, S1S2 heard, no murmurs appreciated Pulm: clear to auscultation bilaterally, no wheezes, rhonchi or rales; normal work of breathing on room air    Assessment/ Plan: 51 y.o. female   New onset type 2 diabetes mellitus ([place]) - Plan: Bayer DCA Hb A1c Waived  Hypertension associated  with diabetes ([place]) - Plan: amLODipine (NORVASC) 5 MG tablet, hydrochlorothiazide (HYDRODIURIL) 25 MG tablet  Hyperlipidemia associated with type 2 diabetes mellitus ([place]) - Plan: omega-3 acid ethyl esters (LOVAZA) 1 g capsule, Lipid panel, CMP14+EGFR  Peripheral neuralgia - Plan: gabapentin (NEURONTIN) 300 MG capsule  Fatty liver disease, nonalcoholic - Plan: atorvastatin (LIPITOR) 40 MG tablet, omega-3 acid ethyl esters (LOVAZA) 1 g capsule  Allergic rhinitis due to pollen, unspecified seasonality - Plan: cetirizine (ZYRTEC) 10 MG tablet  Anxiety and depression - Plan: escitalopram (LEXAPRO) 10 MG tablet  Xanthelasma of eyelid, bilateral - Plan: omega-3 acid ethyl esters (LOVAZA) 1 g capsule  Hypokalemia - Plan: potassium chloride SA (KLOR-CON M) 20 MEQ tablet  Not yet due for A1c.  We will plan to recheck fasting labs.  She will come back in roughly 1 month for these labs.  Tolerating GLP without difficulty.  Okay to advance of 0.5 mg subcutaneously each week as directed.  All meds have been renewed for this patient.  May follow-up in 6 months, sooner if concerns arise  No orders of the defined types were placed in this encounter.  No orders of the defined types were placed in this encounter.    Janora Norlander, DO [place] [phone]

## 2022-06-07 NOTE — Patient Instructions (Signed)

## 2022-06-23 ENCOUNTER — Other Ambulatory Visit: Payer: Self-pay | Admitting: Family Medicine

## 2022-06-23 MED ORDER — OZEMPIC (0.25 OR 0.5 MG/DOSE) 2 MG/1.5ML ~~LOC~~ SOPN: 0.5000 mg | PEN_INJECTOR | SUBCUTANEOUS | 3 refills | Status: DC

## 2022-06-23 NOTE — Telephone Encounter (Signed)
Patient's last office visit was 06/07/22.  Medication is on med list but listed as historical and per protocol I cannot refill and must pass this along to you.

## 2022-06-23 NOTE — Telephone Encounter (Signed)
  Prescription Request  06/23/2022  Is this a "Controlled Substance" medicine?   Have you seen your PCP in the last 2 weeks? Seen 12/6  If YES, route message to pool  -  If NO, patient needs to be scheduled for appointment.  What is the name of the medication or equipment? Semaglutide,0.25 or 0.'5MG'$ /DOS, (OZEMPIC, 0.25 OR 0.5 MG/DOSE,) 2 MG/1.5ML SOPN -- she is on .5  Have you contacted your pharmacy to request a refill? Yes, first time getting from the pahrmacy - will need for 12/24   Which pharmacy would you like this sent to? [facility] in [place]    Patient notified that their request is being sent to the clinical staff for review and that they should receive a response within 2 business days.

## 2022-06-27 ENCOUNTER — Other Ambulatory Visit (HOSPITAL_COMMUNITY): Payer: Self-pay

## 2022-06-27 ENCOUNTER — Other Ambulatory Visit: Payer: Self-pay | Admitting: Family Medicine

## 2022-06-27 ENCOUNTER — Telehealth: Payer: Self-pay

## 2022-06-27 DIAGNOSIS — E119 Type 2 diabetes mellitus without complications: Secondary | ICD-10-CM

## 2022-06-27 MED ORDER — METFORMIN HCL 500 MG PO TABS: 500.0000 mg | ORAL_TABLET | Freq: Every day | ORAL | 3 refills | Status: DC

## 2022-06-27 NOTE — Telephone Encounter (Signed)
Pharmacy Patient Advocate Encounter   Received notification from [facility] that prior authorization for Ozempic '2mg'$ /21m is required/requested.  Per Test Claim: A prior aJosem Kaufmannis required. Metoformin, Byetta, Trulicity and Bydureon are alternate drugs covered by the plan.   For the plan to cover the Ozempic, the insurace wants the patient to be on Metformin as well.   Prior auth not submitted at this time.  Please advise, thank you  [id number]

## 2022-06-27 NOTE — Telephone Encounter (Signed)
Metformin added.  Ozempic rx for new onset DM, FLD, HLD.  Will cc to Almyra Free as Juluis Rainier of [place] as well

## 2022-06-30 NOTE — Telephone Encounter (Signed)
Pharmacy Patient Advocate Encounter   Received notification from [facility] that prior authorization for Ozempic '2mg'$ /1m is required/requested.    [place] submitted on 06/30/22 to (ins) CAdvertising copywritervia CCiscoStatus is pending

## 2022-07-06 NOTE — Telephone Encounter (Signed)
Received a fax regarding Prior Authorization from [place] for [place] 48m/3ml.   Authorization has been DENIED because There is no indication that your patient has met both of the following: A) Individual will continue  maximally tolerated metformin therapy, if not contraindicated per FDA label, intolerant, or otherwise  not a candidate; and B) Documentation of one of the following: 1. Unable to achieve goal HbA1C  despite metformin or metformin-containing regimen (meglitinides, sulfonylureas, or  thiazolidinediones) at greater than or equal to 1,500 mg per day; 2. Intolerance to metformin 1,500  mg per day despite appropriate dose titration duration (for example, period of 8-12 weeks); 3.  Contraindication to metformin per FDA label (for example, acute/chronic metabolic acidosis, severe  renal dysfunction); 4. Not a candidate for metformin (for example, hepatic impairment, moderate  renal dysfunction, unstable heart failure, individual is using an agent for a non-diabetic FDAapproved indication); 5. Initial metformin combination therapy is clinically appropriate for elevated  HbA1C (for example; greater than 1.5% above goal); or 6. Initial metformin combination therapy is  clinically appropriate in an individual with co-morbid conditions (such as ASCVD, heart failure, or  CKD).

## 2022-07-07 NOTE — Telephone Encounter (Signed)
Isn't this PAP?

## 2022-07-17 NOTE — Telephone Encounter (Signed)
Bridget Gutierrez (Key: Carolynn Sayers) Rx #: [id number] Ozempic (0.25 or 0.5 MG/DOSE) '2MG'$ [place] Mediate pen-injectors Form Librarian, academic [place] Form [phone] NCPDP) Created 20 days ago Sent to Plan 20 days ago Plan Response 20 days ago Submit Clinical Questions 17 days ago Determination Unfavorable 16 days ago Message from Plan CaseId:[id number];Status:Denied;Review Type:;Appeal Information: [place] [id number],[id number]; Important - Please read the below note on eAppeals: Please reference the denial letter for information on the rights for an appeal, rationale for the denial, and how to submit an appeal including if any information is needed to support the appeal. Note about urgent situations - Generally, an urgent situation is one which, in the opinion of the provider, the health of the patient may be in serious jeopardy or may experience pain that cannot be adequately controlled while waiting for a decision on the appeal.;

## 2022-07-19 ENCOUNTER — Encounter: Payer: Self-pay | Admitting: Family Medicine

## 2022-07-21 NOTE — Telephone Encounter (Signed)
  I re-submitted PA-->Ozempic now approved (previously denied) Please call patient and pharmacy (she can go online and get copay card or text BEGIN to [phone])  Will benefit from cardiac, liver & diabetic protection Sending to PCP as FYI only :)

## 2022-07-25 ENCOUNTER — Encounter: Payer: Self-pay | Admitting: Family Medicine

## 2022-07-29 ENCOUNTER — Encounter: Payer: Self-pay | Admitting: Family Medicine

## 2022-07-31 ENCOUNTER — Other Ambulatory Visit: Payer: Self-pay | Admitting: Family Medicine

## 2022-10-23 ENCOUNTER — Other Ambulatory Visit: Payer: Self-pay | Admitting: Family Medicine

## 2022-10-23 DIAGNOSIS — E1169 Type 2 diabetes mellitus with other specified complication: Secondary | ICD-10-CM

## 2022-11-20 ENCOUNTER — Other Ambulatory Visit: Payer: Self-pay | Admitting: Family Medicine

## 2022-11-20 DIAGNOSIS — E1169 Type 2 diabetes mellitus with other specified complication: Secondary | ICD-10-CM

## 2022-12-06 NOTE — Patient Instructions (Signed)
Our records indicate that you are due for your annual mammogram/breast imaging. While there is no way to prevent breast cancer, early detection provides the best opportunity for curing it. For women over the age of 40, the American Cancer Society recommends a yearly clinical breast exam and a yearly mammogram. These practices have saved thousands of lives. We need your help to ensure that you are receiving optimal medical care. Please call the imaging location that has done you previous mammograms. Please remember to list us as your primary care. This helps make sure we receive a report and can update your chart.  Below is the contact information for several local breast imaging centers. You may call the location that works best for you, and they will be happy to assistance in making you an appointment. You do not need an order for a regular screening mammogram. However, if you are having any problems or concerns with you breast area, please let your primary care provider know, and appropriate orders will be placed. Please let our office know if you have any questions or concerns. Or if you need information for another imaging center not on this list or outside of the area. We are commented to working with you on your health care journey.   The mobile unit/bus ([facility]) - they come twice a month to our location.  These appointments can be made through our office or by call The [facility]  [facility]  [address] [place], [place] [address] Phone [phone]  [facility] Radiology Department  [address]  [place], [place] [address] [phone]  [facility])  [address] [place], [place] [address] [phone]  [facility] [place]  [address] [place] [address] [phone]  [facility]  [address], [address] [place] [address] [phone]  [facility] [place]  [address] [place], [place] [address] [phone]  [facility] [address] [place], [place] [address] [phone]  [facility] [address]  [address] [place],  [address] [phone]  [facility] [address] [place] [address] [phone]     

## 2022-12-08 ENCOUNTER — Encounter: Payer: Self-pay | Admitting: Family Medicine

## 2022-12-08 ENCOUNTER — Ambulatory Visit: Payer: Managed Care, Other (non HMO) | Admitting: Family Medicine

## 2022-12-08 VITALS — BP 117/77 | HR 73 | Temp 98.5°F | Ht 64.0 in | Wt 174.0 lb

## 2022-12-08 DIAGNOSIS — I152 Hypertension secondary to endocrine disorders: Secondary | ICD-10-CM

## 2022-12-08 DIAGNOSIS — E1159 Type 2 diabetes mellitus with other circulatory complications: Secondary | ICD-10-CM | POA: Diagnosis not present

## 2022-12-08 DIAGNOSIS — Z7984 Long term (current) use of oral hypoglycemic drugs: Secondary | ICD-10-CM | POA: Diagnosis not present

## 2022-12-08 DIAGNOSIS — E1169 Type 2 diabetes mellitus with other specified complication: Secondary | ICD-10-CM

## 2022-12-08 DIAGNOSIS — E785 Hyperlipidemia, unspecified: Secondary | ICD-10-CM

## 2022-12-08 DIAGNOSIS — E119 Type 2 diabetes mellitus without complications: Secondary | ICD-10-CM

## 2022-12-08 LAB — BAYER DCA HB A1C WAIVED: HB A1C (BAYER DCA - WAIVED): 5.8 % — ABNORMAL HIGH (ref 4.8–5.6)

## 2022-12-08 NOTE — Progress Notes (Signed)
Subjective: CC:DM PCP: [name], DO Bridget Gutierrez is a 52 y.o. female presenting to clinic today for:  1. Type 2 Diabetes with hypertension, hyperlipidemia:  Newly diagnosed 06/2022. She has been compliant with metformin, ozempic, repatha.  She denies any nausea, vomiting, abdominal pain.  She does suffer from some constipation but this is not severe.  No straining or blood in stool.  Last eye exam: needs Last foot exam: needs Last A1c:  Lab Results  Component Value Date   HGBA1C 7.2 (H) 04/20/2022   Nephropathy screen indicated?: needs Last flu, zoster and/or pneumovax:  Immunization History  Administered Date(s) Administered   Moderna Sars-Covid-2 Vaccination 10/27/2019, 11/24/2019, 08/11/2020   ROS: Per HPI  No Known Allergies Past Medical History:  Diagnosis Date   Anxiety    B12 deficiency    Depression    Fatty liver    Hyperlipidemia    Hypertension    Tubular adenoma of colon 2019    Current Outpatient Medications:    amLODipine (NORVASC) 5 MG tablet, Take 1 tablet (5 mg total) by mouth daily., Disp: 90 tablet, Rfl: 3   atorvastatin (LIPITOR) 40 MG tablet, Take 1 tablet (40 mg total) by mouth daily., Disp: 90 tablet, Rfl: 3   cetirizine (ZYRTEC) 10 MG tablet, Take 1 tablet (10 mg total) by mouth daily., Disp: 90 tablet, Rfl: 3   cyanocobalamin 500 MCG tablet, Take 500 mcg by mouth daily. , Disp: , Rfl:    escitalopram (LEXAPRO) 10 MG tablet, Take 1 tablet (10 mg total) by mouth daily., Disp: 90 tablet, Rfl: 3   ferrous sulfate 325 (65 FE) MG tablet, Take 325 mg by mouth daily with breakfast., Disp: , Rfl:    gabapentin (NEURONTIN) 300 MG capsule, Take 1 capsule each morning and 2 capsules each evening for neuropathy, Disp: 270 capsule, Rfl: 3   hydrochlorothiazide (HYDRODIURIL) 25 MG tablet, Take 1 tablet (25 mg total) by mouth daily., Disp: 90 tablet, Rfl: 3   metFORMIN (GLUCOPHAGE) 500 MG tablet, Take 1 tablet (500 mg total) by mouth daily with  breakfast., Disp: 90 tablet, Rfl: 3   omega-3 acid ethyl esters (LOVAZA) 1 g capsule, Take 2 capsules (2 g total) by mouth 2 (two) times daily., Disp: 360 capsule, Rfl: 3   potassium chloride SA (KLOR-CON M) 20 MEQ tablet, Take 1 tablet (20 mEq total) by mouth daily., Disp: 90 tablet, Rfl: 3   REPATHA SURECLICK 140 MG/ML SOAJ, INJECT 140 MG SUBCUTANEOUSLY EVERY 14 DAYS, Disp: 2 mL, Rfl: 0   Semaglutide,0.25 or 0.5MG /DOS, (OZEMPIC, 0.25 OR 0.5 MG/DOSE,) 2 MG/1.5ML SOPN, Inject 0.5 mg into the skin every 7 (seven) days., Disp: 9 mL, Rfl: 3 Social History   Socioeconomic History   Marital status: Married    Spouse name: Not on file   Number of children: Not on file   Years of education: Not on file   Highest education level: Not on file  Occupational History   Not on file  Tobacco Use   Smoking status: Every Day    Packs/day: 1.50    Years: 15.00    Additional pack years: 0.00    Total pack years: 22.50    Types: Cigarettes   Smokeless tobacco: Never  Vaping Use   Vaping Use: Never used  Substance and Sexual Activity   Alcohol use: No    Comment: Former 28 cans beer/ week   Drug use: No   Sexual activity: Not Currently  Other Topics Concern  Not on file  Social History Narrative   Not on file   Social Determinants of Health   Financial Resource Strain: Low Risk  (11/15/2020)   Overall Financial Resource Strain (CARDIA)    Difficulty of Paying Living Expenses: Not hard at all  Food Insecurity: No Food Insecurity (11/15/2020)   Hunger Vital Sign    Worried About Running Out of Food in the Last Year: Never true    Ran Out of Food in the Last Year: Never true  Transportation Needs: No Transportation Needs (11/15/2020)   PRAPARE - Administrator, Civil Service (Medical): No    Lack of Transportation (Non-Medical): No  Physical Activity: Insufficiently Active (11/15/2020)   Exercise Vital Sign    Days of Exercise per Week: 1 day    Minutes of Exercise per Session: 30  min  Stress: No Stress Concern Present (11/15/2020)   [employer] of Occupational Health - Occupational Stress Questionnaire    Feeling of Stress : Only a little  Social Connections: Moderately Isolated (11/15/2020)   Social Connection and Isolation Panel [NHANES]    Frequency of Communication with Friends and Family: More than three times a week    Frequency of Social Gatherings with Friends and Family: Once a week    Attends Religious Services: Never    Database administrator or Organizations: No    Attends Banker Meetings: Never    Marital Status: Married  Catering manager Violence: Not At Risk (11/15/2020)   Humiliation, Afraid, Rape, and Kick questionnaire    Fear of Current or Ex-Partner: No    Emotionally Abused: No    Physically Abused: No    Sexually Abused: No   Family History  Problem Relation Age of Onset   COPD Mother    Hypertension Mother    Cancer Mother    Hyperlipidemia Mother    Cervical cancer Mother    Heart disease Mother    Heart attack Mother    Hypertension Father    Cancer Sister    Colon cancer Sister 19   Testicular cancer Brother    Anxiety disorder Daughter     Objective: Office vital signs reviewed. BP 117/77   Pulse 73   Temp 98.5 F (36.9 C)   Ht 5\' 4"  (1.626 m)   Wt 174 lb (78.9 kg)   LMP 09/17/2017 (Approximate)   SpO2 93%   BMI 29.87 kg/m   Physical Examination:  General: Awake, alert, well nourished, No acute distress HEENT: sclera white, MMM.  Xanthelasmas present on bilateral inner eyelids superiorly Cardio: regular rate and rhythm, S1S2 heard, no murmurs appreciated Pulm: clear to auscultation bilaterally, no wheezes, rhonchi or rales; normal work of breathing on room air GI: soft, non-tender, non-distended, bowel sounds present x4, no hepatomegaly, no splenomegaly, no masses  Assessment/ Plan: 52 y.o. female   New onset type 2 diabetes mellitus (HCC) - Plan: Microalbumin / creatinine urine ratio,  CMP14+EGFR, Bayer DCA Hb A1c Waived  Hypertension associated with diabetes (HCC) - Plan: CMP14+EGFR  Hyperlipidemia associated with type 2 diabetes mellitus (HCC) - Plan: CMP14+EGFR, LDL Cholesterol, Direct  Sugar well-controlled now with A1c dropping to 5.8.  Urine microalbumin collected.  Plan for foot exam at next visit.  Needs diabetic eye exam.  Blood pressure well-controlled.  No changes.  Continue to monitor.  Watch for orthostasis as she continues to lose weight on this GLP.  May need to consider backing down on Norvasc or HCTZ  Direct  LDL collected given nonfasting status.  Check liver enzymes.  Continue Repatha.  Follow-up in 6 months for annual physical exam with fasting labs  No orders of the defined types were placed in this encounter.  No orders of the defined types were placed in this encounter.    [name], DO [facility] [phone]

## 2022-12-09 LAB — CMP14+EGFR
ALT: 35 IU/L — ABNORMAL HIGH (ref 0–32)
AST: 30 IU/L (ref 0–40)
Albumin/Globulin Ratio: 2 (ref 1.2–2.2)
Albumin: 4.7 g/dL (ref 3.8–4.9)
Alkaline Phosphatase: 107 IU/L (ref 44–121)
BUN/Creatinine Ratio: 9 (ref 9–23)
BUN: 6 mg/dL (ref 6–24)
Bilirubin Total: 0.4 mg/dL (ref 0.0–1.2)
CO2: 27 mmol/L (ref 20–29)
Calcium: 10.3 mg/dL — ABNORMAL HIGH (ref 8.7–10.2)
Chloride: 99 mmol/L (ref 96–106)
Creatinine, Ser: 0.69 mg/dL (ref 0.57–1.00)
Globulin, Total: 2.4 g/dL (ref 1.5–4.5)
Glucose: 85 mg/dL (ref 70–99)
Potassium: 3.8 mmol/L (ref 3.5–5.2)
Sodium: 140 mmol/L (ref 134–144)
Total Protein: 7.1 g/dL (ref 6.0–8.5)
eGFR: 105 mL/min/{1.73_m2} (ref >59)

## 2022-12-09 LAB — LDL CHOLESTEROL, DIRECT: LDL Direct: 114 mg/dL — ABNORMAL HIGH (ref 0–99)

## 2022-12-09 LAB — MICROALBUMIN / CREATININE URINE RATIO
Creatinine, Urine: 70.3 mg/dL
Microalb/Creat Ratio: 6 mg/g creat (ref 0–29)
Microalbumin, Urine: 3.9 ug/mL

## 2022-12-14 ENCOUNTER — Other Ambulatory Visit: Payer: Self-pay

## 2022-12-14 DIAGNOSIS — Z1231 Encounter for screening mammogram for malignant neoplasm of breast: Secondary | ICD-10-CM

## 2022-12-17 ENCOUNTER — Other Ambulatory Visit: Payer: Self-pay | Admitting: Family Medicine

## 2022-12-17 DIAGNOSIS — E1169 Type 2 diabetes mellitus with other specified complication: Secondary | ICD-10-CM

## 2022-12-21 ENCOUNTER — Ambulatory Visit: Payer: Managed Care, Other (non HMO)

## 2022-12-21 DIAGNOSIS — E119 Type 2 diabetes mellitus without complications: Secondary | ICD-10-CM

## 2022-12-21 NOTE — Progress Notes (Signed)
Bridget Gutierrez arrived 12/21/2022 and has given verbal consent to obtain images and complete their overdue diabetic retinal screening.  The images have been sent to an ophthalmologist or optometrist for review and interpretation.  Results will be sent back to [name], DO for review.  Patient has been informed they will be contacted when we receive the results via telephone or MyChart

## 2022-12-27 ENCOUNTER — Ambulatory Visit
Admission: RE | Admit: 2022-12-27 | Discharge: 2022-12-27 | Disposition: A | Discharge status: Home / Self Care | Payer: Managed Care, Other (non HMO) | Source: Ambulatory Visit | Attending: Family Medicine

## 2022-12-27 DIAGNOSIS — Z1231 Encounter for screening mammogram for malignant neoplasm of breast: Secondary | ICD-10-CM

## 2023-03-11 ENCOUNTER — Other Ambulatory Visit: Payer: Self-pay | Admitting: Family Medicine

## 2023-03-11 DIAGNOSIS — E1169 Type 2 diabetes mellitus with other specified complication: Secondary | ICD-10-CM

## 2023-04-18 ENCOUNTER — Telehealth: Payer: Self-pay

## 2023-04-18 NOTE — Telephone Encounter (Signed)
?  Information regarding your request   Drug is covered by current benefit plan. No further PA activity needed

## 2023-06-04 ENCOUNTER — Other Ambulatory Visit: Payer: Self-pay | Admitting: Family Medicine

## 2023-06-04 DIAGNOSIS — E119 Type 2 diabetes mellitus without complications: Secondary | ICD-10-CM

## 2023-06-04 DIAGNOSIS — J301 Allergic rhinitis due to pollen: Secondary | ICD-10-CM

## 2023-07-06 ENCOUNTER — Other Ambulatory Visit (HOSPITAL_COMMUNITY): Payer: Self-pay

## 2023-07-10 ENCOUNTER — Other Ambulatory Visit (HOSPITAL_COMMUNITY): Payer: Self-pay

## 2023-07-13 ENCOUNTER — Encounter: Payer: Self-pay | Admitting: Family Medicine

## 2023-07-13 ENCOUNTER — Ambulatory Visit (INDEPENDENT_AMBULATORY_CARE_PROVIDER_SITE_OTHER): Payer: 59 | Admitting: Family Medicine

## 2023-07-13 VITALS — BP 128/78 | HR 68 | Temp 98.7°F | Ht 64.0 in | Wt 167.0 lb

## 2023-07-13 DIAGNOSIS — E1159 Type 2 diabetes mellitus with other circulatory complications: Secondary | ICD-10-CM | POA: Diagnosis not present

## 2023-07-13 DIAGNOSIS — Z7985 Long-term (current) use of injectable non-insulin antidiabetic drugs: Secondary | ICD-10-CM

## 2023-07-13 DIAGNOSIS — E785 Hyperlipidemia, unspecified: Secondary | ICD-10-CM

## 2023-07-13 DIAGNOSIS — R102 Pelvic and perineal pain: Secondary | ICD-10-CM

## 2023-07-13 DIAGNOSIS — E1169 Type 2 diabetes mellitus with other specified complication: Secondary | ICD-10-CM

## 2023-07-13 DIAGNOSIS — Z0001 Encounter for general adult medical examination with abnormal findings: Secondary | ICD-10-CM | POA: Diagnosis not present

## 2023-07-13 DIAGNOSIS — Z0000 Encounter for general adult medical examination without abnormal findings: Secondary | ICD-10-CM

## 2023-07-13 DIAGNOSIS — K760 Fatty (change of) liver, not elsewhere classified: Secondary | ICD-10-CM | POA: Diagnosis not present

## 2023-07-13 DIAGNOSIS — D508 Other iron deficiency anemias: Secondary | ICD-10-CM

## 2023-07-13 DIAGNOSIS — F419 Anxiety disorder, unspecified: Secondary | ICD-10-CM

## 2023-07-13 DIAGNOSIS — F32A Depression, unspecified: Secondary | ICD-10-CM

## 2023-07-13 DIAGNOSIS — M792 Neuralgia and neuritis, unspecified: Secondary | ICD-10-CM

## 2023-07-13 DIAGNOSIS — E876 Hypokalemia: Secondary | ICD-10-CM

## 2023-07-13 DIAGNOSIS — Z720 Tobacco use: Secondary | ICD-10-CM

## 2023-07-13 LAB — BAYER DCA HB A1C WAIVED: HB A1C (BAYER DCA - WAIVED): 5.3 % (ref 4.8–5.6)

## 2023-07-13 MED ORDER — ATORVASTATIN CALCIUM 40 MG PO TABS: 40.0000 mg | ORAL_TABLET | Freq: Every day | ORAL | 3 refills | Status: DC

## 2023-07-13 MED ORDER — GABAPENTIN 300 MG PO CAPS: ORAL_CAPSULE | ORAL | 3 refills | Status: DC

## 2023-07-13 MED ORDER — AMLODIPINE BESYLATE 5 MG PO TABS: 5.0000 mg | ORAL_TABLET | Freq: Every day | ORAL | 3 refills | Status: DC

## 2023-07-13 MED ORDER — HYDROCHLOROTHIAZIDE 25 MG PO TABS: 25.0000 mg | ORAL_TABLET | Freq: Every day | ORAL | 3 refills | Status: DC

## 2023-07-13 MED ORDER — REPATHA SURECLICK 140 MG/ML ~~LOC~~ SOAJ: 140.0000 mg | SUBCUTANEOUS | 3 refills | Status: DC

## 2023-07-13 MED ORDER — METFORMIN HCL 500 MG PO TABS: 500.0000 mg | ORAL_TABLET | Freq: Every day | ORAL | 3 refills | Status: DC

## 2023-07-13 MED ORDER — OMEGA-3-ACID ETHYL ESTERS 1 G PO CAPS: 2.0000 | ORAL_CAPSULE | Freq: Two times a day (BID) | ORAL | 3 refills | Status: DC

## 2023-07-13 MED ORDER — OZEMPIC (0.25 OR 0.5 MG/DOSE) 2 MG/1.5ML ~~LOC~~ SOPN: 0.5000 mg | PEN_INJECTOR | SUBCUTANEOUS | 3 refills | Status: DC

## 2023-07-13 MED ORDER — ESCITALOPRAM OXALATE 10 MG PO TABS: 10.0000 mg | ORAL_TABLET | Freq: Every day | ORAL | 3 refills | Status: DC

## 2023-07-13 MED ORDER — POTASSIUM CHLORIDE CRYS ER 20 MEQ PO TBCR: 20.0000 meq | EXTENDED_RELEASE_TABLET | Freq: Every day | ORAL | 3 refills | Status: DC

## 2023-07-13 NOTE — Progress Notes (Signed)
 Bridget Gutierrez is a 53 y.o. female presents to office today for annual physical exam examination.    Concerns today include: 1. Type 2 Diabetes with hypertension, hyperlipidemia:  Reports compliance with GLP, metformin , Lipitor, Lovaza  and amlodipine .  Also takes HCTZ.  No low blood sugars reported.  She denies any GI side effects from the GLP.  She is been out of Repatha  for the last couple of months due to insurance issues.  She has not heard about a prior authorization but notes that she did change insurance companies at the beginning of this year.  Last eye exam: UTD Last foot exam: needs Last A1c:  Lab Results  Component Value Date   HGBA1C 5.3 07/13/2023   Nephropathy screen indicated?: UTD Last flu, zoster and/or pneumovax:  Immunization History  Administered Date(s) Administered   Moderna Sars-Covid-2 Vaccination 10/27/2019, 11/24/2019, 08/11/2020    ROS: She denies any chest pain, shortness of breath, dizziness, falls  2.  Pelvic pain She reports several month history of bilateral pelvic pain.  She reports no abnormal vaginal bleeding or discharge.  She has history of ovarian cysts.  She is postmenopausal  Marital status: married, Substance use: active tobacco user, 1ppd Health Maintenance Due  Topic Date Due   DTaP/Tdap/Td (1 - Tdap) Never done   FOOT EXAM  03/18/2023   HEMOGLOBIN A1C  06/09/2023   Refills needed today: all  Immunization History  Administered Date(s) Administered   Moderna Sars-Covid-2 Vaccination 10/27/2019, 11/24/2019, 08/11/2020   Past Medical History:  Diagnosis Date   Anxiety    B12 deficiency    Depression    Fatty liver    Hyperlipidemia    Hypertension    Tubular adenoma of colon 2019   Social History   Socioeconomic History   Marital status: Married    Spouse name: Not on file   Number of children: Not on file   Years of education: Not on file   Highest education level: Not on file  Occupational History   Not on file   Tobacco Use   Smoking status: Every Day    Current packs/day: 1.50    Average packs/day: 1.5 packs/day for 15.0 years (22.5 ttl pk-yrs)    Types: Cigarettes   Smokeless tobacco: Never  Vaping Use   Vaping status: Never Used  Substance and Sexual Activity   Alcohol use: No    Comment: Former 28 cans beer/ week   Drug use: No   Sexual activity: Not Currently  Other Topics Concern   Not on file  Social History Narrative   Not on file   Social Drivers of Health   Financial Resource Strain: Low Risk  (11/15/2020)   Overall Financial Resource Strain (CARDIA)    Difficulty of Paying Living Expenses: Not hard at all  Food Insecurity: No Food Insecurity (11/15/2020)   Hunger Vital Sign    Worried About Running Out of Food in the Last Year: Never true    Ran Out of Food in the Last Year: Never true  Transportation Needs: No Transportation Needs (11/15/2020)   PRAPARE - Administrator, Civil Service (Medical): No    Lack of Transportation (Non-Medical): No  Physical Activity: Insufficiently Active (11/24/2020)   [facility]   Exercise Vital Sign    Days of Exercise per Week: 2 days    Minutes of Exercise per Session: 30 min  Stress: No Stress Concern Present (11/24/2020)   Received from  [facility]   [facility] of Occupational Health - Occupational Stress Questionnaire    Feeling of Stress : Not at all  Social Connections: Moderately Isolated (11/15/2020)   Social Connection and Isolation Panel [NHANES]    Frequency of Communication with Friends and Family: More than three times a week    Frequency of Social Gatherings with Friends and Family: Once a week    Attends Religious Services: Never    Database Administrator or Organizations: No    Attends Banker Meetings: Never    Marital Status: Married  Catering Manager Violence: Not At Risk (11/15/2020)   Humiliation, Afraid, Rape, and Kick  questionnaire    Fear of Current or Ex-Partner: No    Emotionally Abused: No    Physically Abused: No    Sexually Abused: No   Past Surgical History:  Procedure Laterality Date   CESAREAN SECTION  2000   COLONOSCOPY WITH PROPOFOL  N/A 09/27/2017   Procedure: COLONOSCOPY WITH PROPOFOL ;  Surgeon: Therisa Bi, MD;  Location: [facility] ENDOSCOPY;  Service: Gastroenterology;  Laterality: N/A;   TONSILLECTOMY     Family History  Problem Relation Age of Onset   COPD Mother    Hypertension Mother    Cancer Mother    Hyperlipidemia Mother    Cervical cancer Mother    Heart disease Mother    Heart attack Mother    Hypertension Father    Cancer Sister    Colon cancer Sister 40   Anxiety disorder Daughter    Testicular cancer Brother    Breast cancer Neg Hx     Current Outpatient Medications:    amLODipine  (NORVASC ) 5 MG tablet, Take 1 tablet (5 mg total) by mouth daily., Disp: 90 tablet, Rfl: 3   atorvastatin  (LIPITOR) 40 MG tablet, Take 1 tablet (40 mg total) by mouth daily., Disp: 90 tablet, Rfl: 3   cetirizine  (EQ ALLERGY RELIEF, CETIRIZINE ,) 10 MG tablet, Take 1 tablet by mouth once daily, Disp: 90 tablet, Rfl: 1   cyanocobalamin 500 MCG tablet, Take 500 mcg by mouth daily. , Disp: , Rfl:    escitalopram  (LEXAPRO ) 10 MG tablet, Take 1 tablet (10 mg total) by mouth daily., Disp: 90 tablet, Rfl: 3   Evolocumab  (REPATHA  SURECLICK) 140 MG/ML SOAJ, INJECT 140 MG SUBCUTANEOUSLY EVERY 14 DAYS, Disp: 6 mL, Rfl: 1   ferrous sulfate 325 (65 FE) MG tablet, Take 325 mg by mouth daily with breakfast., Disp: , Rfl:    gabapentin  (NEURONTIN ) 300 MG capsule, Take 1 capsule each morning and 2 capsules each evening for neuropathy, Disp: 270 capsule, Rfl: 3   hydrochlorothiazide  (HYDRODIURIL ) 25 MG tablet, Take 1 tablet (25 mg total) by mouth daily., Disp: 90 tablet, Rfl: 3   metFORMIN  (GLUCOPHAGE ) 500 MG tablet, Take 1 tablet by mouth once daily with breakfast, Disp: 90 tablet, Rfl: 0   omega-3 acid ethyl  esters (LOVAZA ) 1 g capsule, Take 2 capsules (2 g total) by mouth 2 (two) times daily., Disp: 360 capsule, Rfl: 3   potassium chloride  SA (KLOR-CON  M) 20 MEQ tablet, Take 1 tablet (20 mEq total) by mouth daily., Disp: 90 tablet, Rfl: 3   Semaglutide ,0.25 or 0.5MG /DOS, (OZEMPIC , 0.25 OR 0.5 MG/DOSE,) 2 MG/1.5ML SOPN, Inject 0.5 mg into the skin every 7 (seven) days., Disp: 9 mL, Rfl: 3  No Known Allergies   ROS: Review of Systems A comprehensive review of systems was negative except for: Eyes: positive for xanthelasmas of the eyelids  Genitourinary: positive for pelvic pain.  History of ovarian cyst    Physical exam BP 128/78   Pulse 68   Temp 98.7 F (37.1 C)   Ht 5' 4 (1.626 m)   LMP 09/17/2017 (Approximate)   SpO2 98%   Breastfeeding No   BMI 29.87 kg/m  General appearance: alert, cooperative, appears stated age, and no distress Head: Normocephalic, without obvious abnormality, atraumatic Eyes: negative findings: conjunctivae and sclerae normal, corneas clear, pupils equal, round, reactive to light and accomodation, and xanthelasmas noted in the upper inner corners of the eyelids bilaterally Ears: normal TM's and external ear canals both ears Nose: Nares normal. Septum midline. Mucosa normal. No drainage or sinus tenderness. Throat: Moist mucous membranes.  No oropharyngeal masses Neck: no adenopathy, supple, symmetrical, trachea midline, and thyroid not enlarged, symmetric, no tenderness/mass/nodules Back: symmetric, no curvature. ROM normal. No CVA tenderness. Lungs: clear to auscultation bilaterally Heart: regular rate and rhythm, S1, S2 normal, no murmur, click, rub or gallop Abdomen: soft, non-tender; bowel sounds normal; no masses,  no organomegaly Extremities: extremities normal, atraumatic, no cyanosis or edema Pulses: 2+ and symmetric Skin:  Xanthelasmas as above otherwise unremarkable Lymph nodes: Cervical, supraclavicular, and axillary nodes normal. Neurologic:  Grossly normal      12/08/2022    1:39 PM 06/07/2022    2:01 PM 03/17/2022    4:14 PM  Depression screen PHQ 2/9  Decreased Interest 0 0 0  Down, Depressed, Hopeless 0 0 0  PHQ - 2 Score 0 0 0  Altered sleeping 0    Tired, decreased energy 0    Change in appetite 0    Feeling bad or failure about yourself  0    Trouble concentrating 0    Moving slowly or fidgety/restless 0    Suicidal thoughts 0    PHQ-9 Score 0    Difficult doing work/chores Not difficult at all        12/08/2022    1:39 PM 06/07/2022    2:01 PM 03/17/2022    4:14 PM 11/07/2021   11:01 AM  GAD 7 : Generalized Anxiety Score  Nervous, Anxious, on Edge 0 0 0 0  Control/stop worrying 0 0 0 0  Worry too much - different things 0 0 0 0  Trouble relaxing 0 0 0 0  Restless 0 0 0 0  Easily annoyed or irritable 0 0 0 0  Afraid - awful might happen 0 0 0 0  Total GAD 7 Score 0 0 0 0  Anxiety Difficulty Not difficult at all Not difficult at all Not difficult at all Not difficult at all     Assessment/ Plan: Bridget Gutierrez here for annual physical exam.   Annual physical exam  Pelvic pain - Plan: US  Pelvic Complete With Transvaginal  Diabetes mellitus treated with injections of non-insulin medication (HCC) - Plan: CMP14+EGFR, Bayer DCA Hb A1c Waived, metFORMIN  (GLUCOPHAGE ) 500 MG tablet, Semaglutide ,0.25 or 0.5MG /DOS, (OZEMPIC , 0.25 OR 0.5 MG/DOSE,) 2 MG/1.5ML SOPN  Hypertension associated with diabetes (HCC) - Plan: CMP14+EGFR, amLODipine  (NORVASC ) 5 MG tablet, hydrochlorothiazide  (HYDRODIURIL ) 25 MG tablet  Hyperlipidemia associated with type 2 diabetes mellitus (HCC) - Plan: CMP14+EGFR, TSH, Evolocumab  (REPATHA  SURECLICK) 140 MG/ML SOAJ, omega-3 acid ethyl esters (LOVAZA ) 1 g capsule  Fatty liver disease, nonalcoholic - Plan: RFE85+ZHQM, Lipid Panel, atorvastatin  (LIPITOR) 40 MG tablet, omega-3 acid ethyl esters (LOVAZA ) 1 g capsule  Peripheral neuralgia - Plan: gabapentin  (NEURONTIN ) 300 MG capsule  Anxiety and  depression - Plan: escitalopram  (  LEXAPRO ) 10 MG tablet  Hypokalemia - Plan: CMP14+EGFR, potassium chloride  SA (KLOR-CON  M) 20 MEQ tablet  Iron deficiency anemia secondary to inadequate dietary iron intake - Plan: CBC with Differential, Iron, TIBC and Ferritin Panel  Tobacco abuse - Plan: Ambulatory Referral Lung Cancer Scre[facility]  Pelvic ultrasound ordered to further evaluate adnexal pain that was not reproducible on exam today  Sugar under excellent control today.  No changes needed.  Medications have been renewed.  Sample of Ozempic  provided.  She will contact me if she needs help with prior auth.  Encouraged her to go online and get coupon codes for Ozempic  and Repatha   She will continue cholesterol and blood pressure regimen as directed.  Fasting lipid panel collected today.  Patient with known fatty liver.  Continue diet modification and weight loss in efforts to aid in reversal  Gabapentin  renewed.  Neuralgia chronic and stable.  Diabetic foot exam normal today  Anxiety and depression chronic and stable.  Potassium renewed given ongoing use of hydrochlorothiazide   Check iron level, CBC given history of iron deficiency anemia  Referral for lung cancer screening placed.  Today she declined colonoscopy and vaccinations.  Family history is significant for colon cancer in a first-degree relative but she wants to hold off on this for later in the year  Counseled on healthy lifestyle choices, including diet (rich in fruits, vegetables and lean meats and low in salt and simple carbohydrates) and exercise (at least 30 minutes of moderate physical activity daily).  Patient to follow up 71m for DM  Jamilett Ferrante M. Jolinda, DO

## 2023-07-14 LAB — CBC WITH DIFFERENTIAL/PLATELET
Basophils Absolute: 0.1 10*3/uL (ref 0.0–0.2)
Basos: 1 %
EOS (ABSOLUTE): 0.3 10*3/uL (ref 0.0–0.4)
Eos: 2 %
Hematocrit: 42.9 % (ref 34.0–46.6)
Hemoglobin: 15 g/dL (ref 11.1–15.9)
Immature Grans (Abs): 0 10*3/uL (ref 0.0–0.1)
Immature Granulocytes: 0 %
Lymphocytes Absolute: 4.6 10*3/uL — ABNORMAL HIGH (ref 0.7–3.1)
Lymphs: 35 %
MCH: 33.7 pg — ABNORMAL HIGH (ref 26.6–33.0)
MCHC: 35 g/dL (ref 31.5–35.7)
MCV: 96 fL (ref 79–97)
Monocytes Absolute: 0.8 10*3/uL (ref 0.1–0.9)
Monocytes: 6 %
Neutrophils Absolute: 7.2 10*3/uL — ABNORMAL HIGH (ref 1.4–7.0)
Neutrophils: 56 %
Platelets: 439 10*3/uL (ref 150–450)
RBC: 4.45 x10E6/uL (ref 3.77–5.28)
RDW: 12.4 % (ref 11.7–15.4)
WBC: 12.9 10*3/uL — ABNORMAL HIGH (ref 3.4–10.8)

## 2023-07-14 LAB — TSH: TSH: 2.01 u[IU]/mL (ref 0.450–4.500)

## 2023-07-14 LAB — LIPID PANEL
Chol/HDL Ratio: 11.4 {ratio} — ABNORMAL HIGH (ref 0.0–4.4)
Cholesterol, Total: 320 mg/dL — ABNORMAL HIGH (ref 100–199)
HDL: 28 mg/dL — ABNORMAL LOW (ref >39)
LDL Chol Calc (NIH): 257 mg/dL — ABNORMAL HIGH (ref 0–99)
Triglycerides: 175 mg/dL — ABNORMAL HIGH (ref 0–149)
VLDL Cholesterol Cal: 35 mg/dL (ref 5–40)

## 2023-07-14 LAB — CMP14+EGFR
ALT: 37 [IU]/L — ABNORMAL HIGH (ref 0–32)
AST: 30 [IU]/L (ref 0–40)
Albumin: 4.8 g/dL (ref 3.8–4.9)
Alkaline Phosphatase: 98 [IU]/L (ref 44–121)
BUN/Creatinine Ratio: 13 (ref 9–23)
BUN: 8 mg/dL (ref 6–24)
Bilirubin Total: 0.4 mg/dL (ref 0.0–1.2)
CO2: 22 mmol/L (ref 20–29)
Calcium: 10 mg/dL (ref 8.7–10.2)
Chloride: 101 mmol/L (ref 96–106)
Creatinine, Ser: 0.64 mg/dL (ref 0.57–1.00)
Globulin, Total: 2.2 g/dL (ref 1.5–4.5)
Glucose: 77 mg/dL (ref 70–99)
Potassium: 3.7 mmol/L (ref 3.5–5.2)
Sodium: 140 mmol/L (ref 134–144)
Total Protein: 7 g/dL (ref 6.0–8.5)
eGFR: 106 mL/min/{1.73_m2} (ref >59)

## 2023-07-14 LAB — IRON,TIBC AND FERRITIN PANEL
Ferritin: 323 ng/mL — ABNORMAL HIGH (ref 15–150)
Iron Saturation: 45 % (ref 15–55)
Iron: 139 ug/dL (ref 27–159)
Total Iron Binding Capacity: 308 ug/dL (ref 250–450)
UIBC: 169 ug/dL (ref 131–425)

## 2023-07-16 ENCOUNTER — Telehealth: Payer: Self-pay

## 2023-07-16 NOTE — Telephone Encounter (Signed)
 Need PA on Repatha

## 2023-07-17 ENCOUNTER — Telehealth: Payer: Self-pay

## 2023-07-17 ENCOUNTER — Other Ambulatory Visit (HOSPITAL_COMMUNITY): Payer: Self-pay

## 2023-07-17 NOTE — Telephone Encounter (Signed)
 Per test claim: Refill too soon. PA is not needed at this time. Medication was filled 07/13/23. Next eligible fill date is 09/14/23.

## 2023-07-17 NOTE — Telephone Encounter (Signed)
 Pharmacy Patient Advocate Encounter   Received notification from Pt Calls Messages that prior authorization for REPATHA  is required/requested.   Insurance verification completed.   The patient is insured through [facility] .   Per test claim: Refill too soon. PA is not needed at this time. Medication was filled 07/13/23. Next eligible fill date is 09/14/23.

## 2023-07-23 ENCOUNTER — Ambulatory Visit (HOSPITAL_COMMUNITY)
Admission: RE | Admit: 2023-07-23 | Discharge: 2023-07-23 | Disposition: A | Discharge status: Home / Self Care | Payer: 59 | Source: Ambulatory Visit | Attending: Family Medicine | Admitting: Family Medicine

## 2023-07-23 DIAGNOSIS — R102 Pelvic and perineal pain: Secondary | ICD-10-CM | POA: Insufficient documentation

## 2023-07-24 ENCOUNTER — Encounter: Payer: Self-pay | Admitting: Family Medicine

## 2023-09-07 ENCOUNTER — Other Ambulatory Visit: Payer: Self-pay | Admitting: Emergency Medicine

## 2023-09-07 ENCOUNTER — Telehealth: Payer: Self-pay | Admitting: Acute Care

## 2023-09-07 DIAGNOSIS — Z87891 Personal history of nicotine dependence: Secondary | ICD-10-CM

## 2023-09-07 DIAGNOSIS — Z122 Encounter for screening for malignant neoplasm of respiratory organs: Secondary | ICD-10-CM

## 2023-09-07 DIAGNOSIS — F17210 Nicotine dependence, cigarettes, uncomplicated: Secondary | ICD-10-CM

## 2023-09-07 NOTE — Telephone Encounter (Signed)
 Lung Cancer Screening Narrative/Criteria Questionnaire (Cigarette Smokers Only- No Cigars/Pipes/vapes)   Bridget Gutierrez   SDMV:09/24/2023 at 11:15 with Orpha Bur        10-Jun-1971   LDCT: 09/25/2023 at 11:00 at [facility]    53 y.o.   Phone: [phone]  Lung Screening Narrative (confirm age 65-77 yrs Medicare / 50-80 yrs Private pay insurance)   Insurance information:[employer]   Referring Provider:Dr. Delynn Flavin   This screening involves an initial phone call with a team member from our program. It is called a shared decision making visit. The initial meeting is required by  insurance and Medicare to make sure you understand the program. This appointment takes about 15-20 minutes to complete. You will complete the screening scan at your scheduled date/time.  This scan takes about 5-10 minutes to complete. You can eat and drink normally before and after the scan.  Criteria questions for Lung Cancer Screening:   Are you a current or former smoker? Current Age began smoking: 53yo   If you are a former smoker, what year did you quit smoking? N/A(within 15 yrs)   To calculate your smoking history, I need an accurate estimate of how many packs of cigarettes you smoked per day and for how many years. (Not just the number of PPD you are now smoking)   Years smoking 34 x Packs per day 1.5 = Pack years 51   (at least 20 pack yrs)   (Make sure they understand that we need to know how much they have smoked in the past, not just the number of PPD they are smoking now)  Do you have a personal history of cancer?  No    Do you have a family history of cancer? Yes  (cancer type and and relative) Sister - colon, mother- cervical, brother - testicular   Are you coughing up blood?  No  Have you had unexplained weight loss of 15 lbs or more in the last 6 months? No  It looks like you meet all criteria.  When would be a good time for [place] to schedule you for this screening?   Additional information:  N/A

## 2023-09-24 ENCOUNTER — Encounter: Payer: Self-pay | Admitting: Adult Health

## 2023-09-24 ENCOUNTER — Ambulatory Visit: Admitting: Adult Health

## 2023-09-24 DIAGNOSIS — F17210 Nicotine dependence, cigarettes, uncomplicated: Secondary | ICD-10-CM

## 2023-09-24 NOTE — Patient Instructions (Signed)

## 2023-09-24 NOTE — Progress Notes (Signed)
  Virtual Visit via Telephone Note  I connected with Bridget Gutierrez , 09/24/23 11:18 AM by a telemedicine application and verified that I am speaking with the correct person using two identifiers.  Location: Patient: home Provider: home   I discussed the limitations of evaluation and management by telemedicine and the availability of in person appointments. The patient expressed understanding and agreed to proceed.   Shared Decision Making Visit Lung Cancer Screening Program [phone])   Eligibility: 53 y.o. Pack Years Smoking History Calculation = 51 pack years  (# packs/per year x # years smoked) Recent History of coughing up blood  no Unexplained weight loss? no ( >Than 15 pounds within the last 6 months ) Prior History Lung / other cancer no (Diagnosis within the last 5 years already requiring surveillance chest CT Scans). Smoking Status Current Smoker  Visit Components: Discussion included one or more decision making aids. YES Discussion included risk/benefits of screening. YES Discussion included potential follow up diagnostic testing for abnormal scans. YES Discussion included meaning and risk of over diagnosis. YES Discussion included meaning and risk of False Positives. YES Discussion included meaning of total radiation exposure. YES  Counseling Included: Importance of adherence to annual lung cancer LDCT screening. YES Impact of comorbidities on ability to participate in the program. YES Ability and willingness to under diagnostic treatment. YES  Smoking Cessation Counseling: Current Smokers:  Discussed importance of smoking cessation. yes Information about tobacco cessation classes and interventions provided to patient. yes Patient provided with "ticket" for LDCT Scan. yes Symptomatic Patient. NO Diagnosis Code: Tobacco Use Z72.0 Asymptomatic Patient yes  Counseling (Intermediate counseling: > three minutes counseling) U0454 (CT Chest Lung Cancer Screening Low  Dose W/O CM) [id number]  Z12.2-Screening of respiratory organs Z87.891-Personal history of nicotine dependence   Danford Bad 09/24/23

## 2023-09-25 ENCOUNTER — Ambulatory Visit
Admission: RE | Admit: 2023-09-25 | Discharge: 2023-09-25 | Disposition: A | Discharge status: Home / Self Care | Source: Ambulatory Visit | Attending: Acute Care | Admitting: Acute Care

## 2023-09-25 DIAGNOSIS — F17210 Nicotine dependence, cigarettes, uncomplicated: Secondary | ICD-10-CM

## 2023-09-25 DIAGNOSIS — Z87891 Personal history of nicotine dependence: Secondary | ICD-10-CM

## 2023-09-25 DIAGNOSIS — Z122 Encounter for screening for malignant neoplasm of respiratory organs: Secondary | ICD-10-CM

## 2023-10-29 ENCOUNTER — Other Ambulatory Visit: Payer: Self-pay | Admitting: Acute Care

## 2023-10-29 DIAGNOSIS — Z87891 Personal history of nicotine dependence: Secondary | ICD-10-CM

## 2023-10-29 DIAGNOSIS — Z122 Encounter for screening for malignant neoplasm of respiratory organs: Secondary | ICD-10-CM

## 2023-10-29 DIAGNOSIS — F17210 Nicotine dependence, cigarettes, uncomplicated: Secondary | ICD-10-CM

## 2023-11-12 ENCOUNTER — Ambulatory Visit: Payer: 59 | Admitting: Family Medicine

## 2023-11-20 ENCOUNTER — Ambulatory Visit: Admitting: Family Medicine

## 2023-11-20 ENCOUNTER — Encounter: Payer: Self-pay | Admitting: Family Medicine

## 2023-11-20 VITALS — BP 122/87 | HR 79 | Temp 98.4°F | Ht 64.0 in | Wt 171.8 lb

## 2023-11-20 DIAGNOSIS — E1159 Type 2 diabetes mellitus with other circulatory complications: Secondary | ICD-10-CM | POA: Diagnosis not present

## 2023-11-20 DIAGNOSIS — Z23 Encounter for immunization: Secondary | ICD-10-CM

## 2023-11-20 DIAGNOSIS — E1169 Type 2 diabetes mellitus with other specified complication: Secondary | ICD-10-CM | POA: Diagnosis not present

## 2023-11-20 DIAGNOSIS — J301 Allergic rhinitis due to pollen: Secondary | ICD-10-CM | POA: Diagnosis not present

## 2023-11-20 DIAGNOSIS — D508 Other iron deficiency anemias: Secondary | ICD-10-CM

## 2023-11-20 DIAGNOSIS — Z7985 Long-term (current) use of injectable non-insulin antidiabetic drugs: Secondary | ICD-10-CM

## 2023-11-20 DIAGNOSIS — E785 Hyperlipidemia, unspecified: Secondary | ICD-10-CM

## 2023-11-20 DIAGNOSIS — I2510 Atherosclerotic heart disease of native coronary artery without angina pectoris: Secondary | ICD-10-CM | POA: Insufficient documentation

## 2023-11-20 DIAGNOSIS — I152 Hypertension secondary to endocrine disorders: Secondary | ICD-10-CM

## 2023-11-20 LAB — BAYER DCA HB A1C WAIVED: HB A1C (BAYER DCA - WAIVED): 5 % (ref 4.8–5.6)

## 2023-11-20 MED ORDER — CETIRIZINE HCL 10 MG PO TABS: 10.0000 mg | ORAL_TABLET | Freq: Every day | ORAL | 1 refills | Status: DC

## 2023-11-20 NOTE — Progress Notes (Signed)
 Subjective: CC:DM PCP: Eliodoro Guerin, DO Bridget Gutierrez is a 53 y.o. female presenting to clinic today for:  1. Type 2 Diabetes with hypertension, hyperlipidemia:  Compliant with all medications.  No missed doses.  No hypoglycemic episodes.  Lowest blood sugar that she seen is 98 and highest 119.  Diabetes Health Maintenance Due  Topic Date Due   HEMOGLOBIN A1C  01/10/2024   OPHTHALMOLOGY EXAM  02/28/2024   FOOT EXAM  07/12/2024    Last A1c:  Lab Results  Component Value Date   HGBA1C 5.3 07/13/2023    ROS: Denies dizziness, LOC, polyuria, polydipsia, unintended weight loss/gain, foot ulcerations, numbness or tingling in extremities, shortness of breath or chest pain.   ROS: Per HPI  No Known Allergies Past Medical History:  Diagnosis Date   Anxiety    B12 deficiency    Depression    Fatty liver    Hyperlipidemia    Hypertension    Tubular adenoma of colon 2019    Current Outpatient Medications:    amLODipine  (NORVASC ) 5 MG tablet, Take 1 tablet (5 mg total) by mouth daily., Disp: 90 tablet, Rfl: 3   atorvastatin  (LIPITOR) 40 MG tablet, Take 1 tablet (40 mg total) by mouth daily., Disp: 90 tablet, Rfl: 3   cetirizine  (EQ ALLERGY RELIEF, CETIRIZINE ,) 10 MG tablet, Take 1 tablet (10 mg total) by mouth daily., Disp: 90 tablet, Rfl: 1   cyanocobalamin 500 MCG tablet, Take 500 mcg by mouth daily. , Disp: , Rfl:    escitalopram  (LEXAPRO ) 10 MG tablet, Take 1 tablet (10 mg total) by mouth daily., Disp: 90 tablet, Rfl: 3   Evolocumab  (REPATHA  SURECLICK) 140 MG/ML SOAJ, Inject 140 mg as directed every 14 (fourteen) days., Disp: 6 mL, Rfl: 3   ferrous sulfate 325 (65 FE) MG tablet, Take 325 mg by mouth daily with breakfast., Disp: , Rfl:    gabapentin  (NEURONTIN ) 300 MG capsule, Take 1 capsule each morning and 2 capsules each evening for neuropathy, Disp: 270 capsule, Rfl: 3   hydrochlorothiazide  (HYDRODIURIL ) 25 MG tablet, Take 1 tablet (25 mg total) by mouth daily.,  Disp: 90 tablet, Rfl: 3   metFORMIN  (GLUCOPHAGE ) 500 MG tablet, Take 1 tablet (500 mg total) by mouth daily with breakfast., Disp: 90 tablet, Rfl: 3   omega-3 acid ethyl esters (LOVAZA ) 1 g capsule, Take 2 capsules (2 g total) by mouth 2 (two) times daily., Disp: 360 capsule, Rfl: 3   potassium chloride  SA (KLOR-CON  M) 20 MEQ tablet, Take 1 tablet (20 mEq total) by mouth daily., Disp: 90 tablet, Rfl: 3   Semaglutide ,0.25 or 0.5MG /DOS, (OZEMPIC , 0.25 OR 0.5 MG/DOSE,) 2 MG/1.5ML SOPN, Inject 0.5 mg into the skin every 7 (seven) days., Disp: 9 mL, Rfl: 3 Social History   Socioeconomic History   Marital status: Married    Spouse name: Not on file   Number of children: Not on file   Years of education: Not on file   Highest education level: Not on file  Occupational History   Not on file  Tobacco Use   Smoking status: Every Day    Current packs/day: 1.50    Average packs/day: 1.5 packs/day for 15.0 years (22.5 ttl pk-yrs)    Types: Cigarettes   Smokeless tobacco: Never  Vaping Use   Vaping status: Never Used  Substance and Sexual Activity   Alcohol use: No    Comment: Former 28 cans beer/ week   Drug use: No   Sexual activity: Not  Currently  Other Topics Concern   Not on file  Social History Narrative   Not on file   Social Drivers of Health   Financial Resource Strain: Low Risk  (11/15/2020)   Overall Financial Resource Strain (CARDIA)    Difficulty of Paying Living Expenses: Not hard at all  Food Insecurity: No Food Insecurity (11/15/2020)   Hunger Vital Sign    Worried About Running Out of Food in the Last Year: Never true    Ran Out of Food in the Last Year: Never true  Transportation Needs: No Transportation Needs (11/15/2020)   PRAPARE - Administrator, Civil Service (Medical): No    Lack of Transportation (Non-Medical): No  Physical Activity: Insufficiently Active (11/24/2020)   [facility], [facility]   Exercise Vital Sign    Days of  Exercise per Week: 2 days    Minutes of Exercise per Session: 30 min  Stress: No Stress Concern Present (11/24/2020)   Received from [facility], [facility] - Occupational Stress Questionnaire    Feeling of Stress : Not at all  Social Connections: Moderately Isolated (11/15/2020)   Social Connection and Isolation Panel [NHANES]    Frequency of Communication with Friends and Family: More than three times a week    Frequency of Social Gatherings with Friends and Family: Once a week    Attends Religious Services: Never    Database administrator or Organizations: No    Attends Banker Meetings: Never    Marital Status: Married  Catering manager Violence: Not At Risk (11/15/2020)   Humiliation, Afraid, Rape, and Kick questionnaire    Fear of Current or Ex-Partner: No    Emotionally Abused: No    Physically Abused: No    Sexually Abused: No   Family History  Problem Relation Age of Onset   COPD Mother    Hypertension Mother    Cancer Mother    Hyperlipidemia Mother    Cervical cancer Mother    Heart disease Mother    Heart attack Mother    Hypertension Father    Cancer Sister    Colon cancer Sister 71   Anxiety disorder Daughter    Testicular cancer Brother    Breast cancer Neg Hx     Objective: Office vital signs reviewed. BP 122/87   Pulse 79   Temp 98.4 F (36.9 C)   Ht 5\' 4"  (1.626 m)   Wt 171 lb 12.8 oz (77.9 kg)   LMP 09/17/2017 (Approximate)   SpO2 98%   BMI 29.49 kg/m   Physical Examination:  General: Awake, alert, well nourished, No acute distress HEENT: Xanthelasmas at the medial corners of bilateral upper eyelid present Cardio: regular rate and rhythm, S1S2 heard, no murmurs appreciated Pulm: clear to auscultation bilaterally, no wheezes, rhonchi or rales; normal work of breathing on room air  Assessment/ Plan: 53 y.o. female   Diabetes mellitus treated with injections of non-insulin  medication (HCC) - Plan: Bayer DCA Hb A1c Waived, Microalbumin/Creatinine Ratio, Urine  Hypertension associated with diabetes (HCC)  Hyperlipidemia associated with type 2 diabetes mellitus (HCC)  Iron deficiency anemia secondary to inadequate dietary iron intake - Plan: Iron, TIBC and Ferritin Panel  Seasonal allergic rhinitis due to pollen - Plan: cetirizine  (EQ ALLERGY RELIEF, CETIRIZINE ,) 10 MG tablet  Coronary artery calcification seen on CAT scan  Sugar under excellent troll with A1c of 5.0 today.  Plan for DC of metformin  at next visit  Blood pressure controlled.  No changes  Continue therapies for cholesterol as prescribed  Repeat iron panel today  Discussed coronary artery calcification seen on CT lung cancer screening.  Does not wish to see cardiologist at this time.  Discussed need to maximize medical therapy  Allergic rhinitis chronic and stable on Zyrtec  renewed  Eliodoro Guerin, DO [facility] [phone]

## 2023-11-20 NOTE — Patient Instructions (Addendum)
 Sugar looks great! A1c 5.0  Coronary Artery Disease, Female Coronary artery disease (CAD) is a condition in which the arteries that lead to the heart (coronary arteries) become narrow or blocked. The narrowing or blockage can lead to decreased blood flow to the heart. Prolonged reduced blood flow can cause a heart attack (myocardial infarction, or MI). This condition may also be called coronary heart disease. CAD is the most common type of heart disease, and heart disease is the leading cause of death in women. It is important to understand what causes CAD and how it is treated. What are the causes? CAD is most often caused by atherosclerosis. This is the buildup of fat and cholesterol (plaque) on the inside of the arteries. Over time, the plaque may narrow or block the artery, reducing blood flow to the heart. Plaque can also become weak and break off within a coronary artery and cause a sudden blockage. Other less common causes of CAD include: A blood clot or a piece of another substance that blocks the flow of blood in a coronary artery (embolism). A tearing of the artery (spontaneous coronary artery dissection). An enlargement of an artery (aneurysm). Inflammation (vasculitis) in the artery wall. What increases the risk? The following factors may make you more likely to develop this condition: Age. Women older than 55 years are at a greater risk of CAD. Family history of CAD. High blood pressure (hypertension). Diabetes. High cholesterol levels. Obesity. Menopause. All postmenopausal women are at greater risk of CAD. Women who have experienced menopause between the ages of 72 and 6 (early menopause) are at a higher risk of CAD. Women who have experienced menopause before age 39 (premature menopause) are at a very high risk of CAD. Other risk factors include: Tobacco use. Excessive alcohol use. Lack of exercise. A diet high in saturated and trans fats, such as fried food and processed  meat. What are the signs or symptoms? Many people do not have any symptoms during the early stages of CAD. As the condition progresses, symptoms may include: Chest pain (angina). The pain can: Feel like crushing or squeezing, or like a tightness, pressure, fullness, or heaviness in the chest. Last more than a few minutes or can stop and recur. The pain tends to get worse with exercise or stress and to fade with rest. Pain in the arms, neck, jaw, ear, or back. Unexplained heartburn or indigestion. Shortness of breath. Nausea. Sudden light-headedness. Sudden cold sweats. Fluttering or fast heartbeat (palpitations). Many women have chest discomfort and the other symptoms. However, women often have unusual (atypical) symptoms, such as: Fatigue. Vomiting. Unexplained feelings of nervousness or anxiety. Unexplained weakness. Dizziness or fainting. How is this diagnosed? This condition is diagnosed based on: Your family and medical history. A physical exam. Tests. These may include: A test to check the electrical signals in your heart (electrocardiogram). Exercise stress test. This looks for signs of blockage when the heart is stressed with exercise, such as running on a treadmill. Pharmacologic stress test. This test looks for signs of blockage when the heart is being stressed with a medicine. Blood tests to check levels of cardiac enzymes such as troponin and creatine kinase. Coronary angiogram. This is a procedure to look at the coronary arteries to see if there is any blockage. During this test, a dye is injected into your arteries so they appear on an X-ray. Coronary artery CT scan. This scan helps detect calcium  deposits in your coronary arteries. Calcium  deposits are an indicator  of CAD. A test that uses sound waves to take a picture of your heart (echocardiogram). How is this treated? This condition may be treated by: Healthy lifestyle changes to reduce risk factors. Medicines such  as: Antiplatelet medicines such as clopidogrel or aspirin. These help to prevent blood clots. Nitroglycerin. Blood pressure medicines. Cholesterol-lowering medicine. Coronary angioplasty and stenting. During this procedure, a thin, flexible tube is inserted through a blood vessel and into a blocked artery. A balloon or similar device on the end of the tube is inflated to open up the artery. In some cases, a small, mesh tube (stent) is inserted into the artery to keep it open. Coronary artery bypass surgery. During this surgery, veins or arteries from other parts of the body are used to create a bypass around the blockage and allow blood to reach your heart. Follow these instructions at home: Medicines Take over-the-counter and prescription medicines only as told by your health care provider. Do not take the following medicines unless your health care provider approves: NSAIDs, such as ibuprofen, naproxen, or celecoxib. Vitamin supplements that contain vitamin A, vitamin E, or both. Hormone replacement therapy that contains estrogen with or without progestin. Lifestyle Follow an exercise program approved by your health care provider. Ask your health care provider if cardiac rehab is appropriate. Maintain a healthy weight or lose weight as approved by your health care provider. Learn to manage stress or try to limit your stress. Ask your health care provider for suggestions if you need help. Get screened for depression and seek treatment, if needed. Do not use any products that contain nicotine  or tobacco. These products include cigarettes, chewing tobacco, and vaping devices, such as e-cigarettes. If you need help quitting, ask your health care provider. Eating and drinking  Follow a heart-healthy diet. A dietitian can help educate you about healthy food options and changes. In general, eat plenty of fruits and vegetables, lean meats, and whole grains. Avoid foods high in: Sugar. Salt  (sodium). Saturated fats, such as processed or fatty meat. Trans fats, such as fried food. Use healthy cooking methods such as roasting, grilling, broiling, baking, poaching, steaming, or stir-frying. Do not drink alcohol if: Your health care provider tells you not to drink. You are pregnant, may be pregnant, or are planning to become pregnant. If you drink alcohol: Limit how much you have to 0-1 drink a day. Know how much alcohol is in your drink. In the [place] one drink equals one 12 oz bottle of beer (355 mL), one 5 oz glass of wine (148 mL), or one 1 oz glass of hard liquor (44 mL). General instructions Manage any other health conditions, such as high cholesterol, hypertension, and diabetes. These conditions affect your heart. Your health care provider may ask you to monitor your blood pressure. Keep all follow-up visits. This is important. Get help right away if: You have pain in your chest, neck, ear, arm, jaw, stomach, or back that: Lasts more than a few minutes. Is recurring. Is not relieved by taking medicine under your tongue (sublingual nitroglycerin). You have profuse sweating without cause. You have unexplained: Heartburn or indigestion. Shortness of breath or difficulty breathing. Fluttering or fast heartbeat (palpitations). Fatigue or weakness. Nausea or vomiting. Feelings of nervousness or anxiety. You have sudden light-headedness or dizziness. You faint. These symptoms may be an emergency. Get help right away. Call 911. Do not wait to see if the symptoms will go away. Do not drive yourself to the hospital. Summary Coronary artery  disease (CAD) is a condition in which the arteries that lead to the heart (coronary arteries) become narrow or blocked. Prolonged reduced blood flow can cause a heart attack. Many women have chest discomfort and other common symptoms of CAD. However, women often have unusual (atypical) symptoms, such as fatigue, vomiting, weakness, or  dizziness. CAD can be treated with lifestyle changes, medicines, coronary angioplasty or stents, coronary artery bypass surgery, or a combination of these treatments. Keep all follow-up visits. This is important. This information is not intended to replace advice given to you by your health care provider. Make sure you discuss any questions you have with your health care provider. Document Revised: 05/18/2021 Document Reviewed: 05/18/2021 Elsevier Patient Education  2024 [employer].

## 2023-11-21 ENCOUNTER — Ambulatory Visit: Payer: Self-pay | Admitting: Family Medicine

## 2023-11-21 LAB — IRON,TIBC AND FERRITIN PANEL
Ferritin: 339 ng/mL — ABNORMAL HIGH (ref 15–150)
Iron Saturation: 37 % (ref 15–55)
Iron: 113 ug/dL (ref 27–159)
Total Iron Binding Capacity: 309 ug/dL (ref 250–450)
UIBC: 196 ug/dL (ref 131–425)

## 2023-11-21 LAB — LIPID PANEL
Chol/HDL Ratio: 6.4 ratio — ABNORMAL HIGH (ref 0.0–4.4)
Cholesterol, Total: 210 mg/dL — ABNORMAL HIGH (ref 100–199)
HDL: 33 mg/dL — ABNORMAL LOW (ref >39)
LDL Chol Calc (NIH): 139 mg/dL — ABNORMAL HIGH (ref 0–99)
Triglycerides: 212 mg/dL — ABNORMAL HIGH (ref 0–149)
VLDL Cholesterol Cal: 38 mg/dL (ref 5–40)

## 2023-11-21 LAB — MICROALBUMIN / CREATININE URINE RATIO
Creatinine, Urine: 32.4 mg/dL
Microalb/Creat Ratio: <9 mg/g{creat} (ref 0–29)
Microalbumin, Urine: <3 ug/mL

## 2023-12-19 ENCOUNTER — Telehealth: Payer: Self-pay | Admitting: Family Medicine

## 2024-01-04 ENCOUNTER — Emergency Department (HOSPITAL_COMMUNITY)
Admission: EM | Admit: 2024-01-04 | Discharge: 2024-01-04 | Disposition: A | Discharge status: Home / Self Care | Attending: Emergency Medicine | Admitting: Emergency Medicine

## 2024-01-04 ENCOUNTER — Other Ambulatory Visit: Payer: Self-pay

## 2024-01-04 ENCOUNTER — Encounter (HOSPITAL_COMMUNITY): Payer: Self-pay

## 2024-01-04 DIAGNOSIS — Z79899 Other long term (current) drug therapy: Secondary | ICD-10-CM | POA: Diagnosis not present

## 2024-01-04 DIAGNOSIS — Z7984 Long term (current) use of oral hypoglycemic drugs: Secondary | ICD-10-CM | POA: Insufficient documentation

## 2024-01-04 DIAGNOSIS — I10 Essential (primary) hypertension: Secondary | ICD-10-CM | POA: Diagnosis not present

## 2024-01-04 DIAGNOSIS — F17210 Nicotine dependence, cigarettes, uncomplicated: Secondary | ICD-10-CM | POA: Insufficient documentation

## 2024-01-04 DIAGNOSIS — J029 Acute pharyngitis, unspecified: Secondary | ICD-10-CM | POA: Diagnosis present

## 2024-01-04 DIAGNOSIS — E119 Type 2 diabetes mellitus without complications: Secondary | ICD-10-CM | POA: Insufficient documentation

## 2024-01-04 LAB — GROUP A STREP BY PCR: Group A Strep by PCR: NOT DETECTED

## 2024-01-04 LAB — RESP PANEL BY RT-PCR (RSV, FLU A&B, COVID)  RVPGX2
Influenza A by PCR: NEGATIVE
Influenza B by PCR: NEGATIVE
Resp Syncytial Virus by PCR: NEGATIVE
SARS Coronavirus 2 by RT PCR: NEGATIVE

## 2024-01-04 MED ORDER — HYDROCODONE-ACETAMINOPHEN 5-325 MG PO TABS
1.0000 | ORAL_TABLET | Freq: Once | ORAL | Status: AC
  Administered 2024-01-04: 1 via ORAL
  Filled 2024-01-04: qty 1

## 2024-01-04 MED ORDER — IBUPROFEN 600 MG PO TABS: 600.0000 mg | ORAL_TABLET | Freq: Four times a day (QID) | ORAL | 0 refills | Status: AC | PRN

## 2024-01-04 MED ORDER — NAPROXEN 250 MG PO TABS
500.0000 mg | ORAL_TABLET | Freq: Once | ORAL | Status: AC
  Administered 2024-01-04: 500 mg via ORAL
  Filled 2024-01-04: qty 2

## 2024-01-04 NOTE — ED Triage Notes (Signed)
 Pt states she had a tickle last Saturday when visiting hospital in [place], Woke up Sunday and throat has been sore sonce. Has tried gargling warm salt water, chloreseotic spray, and used mucinex with no relief. Hx of diabetes and HTN. No blood thinners.

## 2024-01-04 NOTE — ED Provider Notes (Signed)
 [facility] AT [facility] Provider Note   CSN: [id number] Arrival date & time: 01/04/24  1047     Patient presents with: Sore Throat   Bridget Gutierrez is a 53 y.o. female.   HPI    53 year old female comes in with chief complaint of sore throat.  Patient has history of tobacco use disorder, hypertension, diabetes.  She states that she has had a sore throat since the last 4 or 5 days.  This morning, the sore throat was severe, prompting her to come to the ER.  She has tried salt water gargle and over-the-counter topical sprays.  Patient denies any change in voice, difficulty in swallowing.  She smokes about 1 pack a day.  Review of system is negative for any fevers, chills.  Prior to Admission medications   Medication Sig Start Date End Date Taking? Authorizing Provider  ibuprofen  (ADVIL ) 600 MG tablet Take 1 tablet (600 mg total) by mouth every 6 (six) hours as needed. 01/04/24  Yes Charlyn Sora, MD  amLODipine  (NORVASC ) 5 MG tablet Take 1 tablet (5 mg total) by mouth daily. 07/13/23   Jolinda Norene HERO, DO  atorvastatin  (LIPITOR) 40 MG tablet Take 1 tablet (40 mg total) by mouth daily. 07/13/23   Jolinda Norene HERO, DO  cetirizine  (EQ ALLERGY RELIEF, CETIRIZINE ,) 10 MG tablet Take 1 tablet (10 mg total) by mouth daily. 11/20/23   Jolinda Norene HERO, DO  cyanocobalamin 500 MCG tablet Take 500 mcg by mouth daily.     [provider]  escitalopram  (LEXAPRO ) 10 MG tablet Take 1 tablet (10 mg total) by mouth daily. 07/13/23   Jolinda Norene HERO, DO  Evolocumab  (REPATHA  SURECLICK) 140 MG/ML SOAJ Inject 140 mg as directed every 14 (fourteen) days. 07/13/23   Jolinda Norene HERO, DO  ferrous sulfate 325 (65 FE) MG tablet Take 325 mg by mouth daily with breakfast.    [provider]  gabapentin  (NEURONTIN ) 300 MG capsule Take 1 capsule each morning and 2 capsules each evening for neuropathy 07/13/23   Jolinda Norene M, DO  hydrochlorothiazide   (HYDRODIURIL ) 25 MG tablet Take 1 tablet (25 mg total) by mouth daily. 07/13/23   Jolinda Norene HERO, DO  metFORMIN  (GLUCOPHAGE ) 500 MG tablet Take 1 tablet (500 mg total) by mouth daily with breakfast. 07/13/23   Jolinda Norene M, DO  omega-3 acid ethyl esters (LOVAZA ) 1 g capsule Take 2 capsules (2 g total) by mouth 2 (two) times daily. 07/13/23   Jolinda Norene HERO, DO  potassium chloride  SA (KLOR-CON  M) 20 MEQ tablet Take 1 tablet (20 mEq total) by mouth daily. 07/13/23   Jolinda Norene HERO, DO  Semaglutide ,0.25 or 0.5MG /DOS, (OZEMPIC , 0.25 OR 0.5 MG/DOSE,) 2 MG/1.5ML SOPN Inject 0.5 mg into the skin every 7 (seven) days. 07/13/23   Jolinda Norene HERO, DO    Allergies: Patient has no known allergies.    Review of Systems  All other systems reviewed and are negative.   Updated Vital Signs BP (!) 131/94 (BP Location: Right Arm)   Pulse 69   Temp 98 F (36.7 C) (Oral)   Resp 20   Ht 5' 4 (1.626 m)   Wt 76.7 kg   LMP 09/17/2017 (Approximate)   SpO2 99%   BMI 29.01 kg/m   Physical Exam Vitals and nursing note reviewed.  Constitutional:      Appearance: She is well-developed.  HENT:     Head: Atraumatic.     Comments: Pt has no  trismus, stridor or crepitus over the neck. Pt has no tonsillar enlargement and exudates. Pharynx is erythematous.     Mouth/Throat:     Pharynx: No oropharyngeal exudate.     Tonsils: No tonsillar exudate or tonsillar abscesses.     Comments: Patient has pharyngeal edema with erythema but there is no exudates Cardiovascular:     Rate and Rhythm: Normal rate.  Pulmonary:     Effort: Pulmonary effort is normal.     Breath sounds: No stridor.  Musculoskeletal:     Cervical back: Normal range of motion and neck supple.  Skin:    General: Skin is warm and dry.  Neurological:     Mental Status: She is alert and oriented to person, place, and time.     (all labs ordered are listed, but only abnormal results are displayed) Labs Reviewed  RESP  PANEL BY RT-PCR (RSV, FLU A&B, COVID)  [id number]  GROUP A STREP BY PCR    EKG: None  Radiology: No results found.   Procedures   Medications Ordered in the ED  naproxen  (NAPROSYN ) tablet 500 mg (has no administration in time range)  HYDROcodone -acetaminophen  (NORCO/VICODIN) 5-325 MG per tablet 1 tablet (has no administration in time range)                                    Medical Decision Making Risk Prescription drug management.   53 year old patient with history of diabetes, tobacco use disorder, hypertension, hyperlipidemia comes in with chief complaint of sore throat.  Patient denies any associated stridor, nausea, vomiting, fevers, chills, drooling, difficulty in swallowing or voice change.  On exam, there is pharyngeal erythema and edema, but no exudates.  No tonsillar enlargement.   Differential diagnosis for this patient includes PTA, viral pharyngitis, tumor.  Symptoms have been present now for at least 4 days.  I have advised patient to continue with conservative measures for now.  If however she does not get better in another 3-5 days then she needs to consult her PCP, as she will likely need advanced imaging or ENT follow-up.  I made this recommendation clear to the patient.  Smoking cessation instruction/counseling given:  commended patient for quitting and reviewed strategies for preventing relapses.   Final diagnoses:  Viral pharyngitis    ED Discharge Orders          Ordered    ibuprofen  (ADVIL ) 600 MG tablet  Every 6 hours PRN        01/04/24 1240               Charlyn Sora, MD 01/04/24 1246

## 2024-01-04 NOTE — Discharge Instructions (Addendum)
 Take ibuprofen  every 6 hours.  You can take Tylenol  in between if needed. Salt water gargle twice a day. Hydrate well.  If your sore throat does not improve in 5 days, then consider calling your PCP for close follow-up.

## 2024-01-14 ENCOUNTER — Other Ambulatory Visit: Payer: Self-pay | Admitting: Family Medicine

## 2024-01-14 ENCOUNTER — Ambulatory Visit
Admission: RE | Admit: 2024-01-14 | Discharge: 2024-01-14 | Disposition: A | Discharge status: Home / Self Care | Source: Ambulatory Visit | Attending: Family Medicine | Admitting: Family Medicine

## 2024-01-14 DIAGNOSIS — Z1231 Encounter for screening mammogram for malignant neoplasm of breast: Secondary | ICD-10-CM

## 2024-05-26 ENCOUNTER — Ambulatory Visit: Payer: Self-pay | Admitting: Family Medicine

## 2024-05-26 ENCOUNTER — Encounter: Payer: Self-pay | Admitting: Family Medicine

## 2024-05-26 VITALS — BP 134/84 | HR 75 | Temp 97.8°F | Ht 64.0 in | Wt 168.5 lb

## 2024-05-26 DIAGNOSIS — K760 Fatty (change of) liver, not elsewhere classified: Secondary | ICD-10-CM

## 2024-05-26 DIAGNOSIS — F32A Depression, unspecified: Secondary | ICD-10-CM

## 2024-05-26 DIAGNOSIS — Z7985 Long-term (current) use of injectable non-insulin antidiabetic drugs: Secondary | ICD-10-CM

## 2024-05-26 DIAGNOSIS — F419 Anxiety disorder, unspecified: Secondary | ICD-10-CM | POA: Diagnosis not present

## 2024-05-26 DIAGNOSIS — E1159 Type 2 diabetes mellitus with other circulatory complications: Secondary | ICD-10-CM | POA: Diagnosis not present

## 2024-05-26 DIAGNOSIS — E119 Type 2 diabetes mellitus without complications: Secondary | ICD-10-CM

## 2024-05-26 DIAGNOSIS — Z789 Other specified health status: Secondary | ICD-10-CM

## 2024-05-26 DIAGNOSIS — M792 Neuralgia and neuritis, unspecified: Secondary | ICD-10-CM | POA: Insufficient documentation

## 2024-05-26 DIAGNOSIS — I152 Hypertension secondary to endocrine disorders: Secondary | ICD-10-CM

## 2024-05-26 DIAGNOSIS — E1169 Type 2 diabetes mellitus with other specified complication: Secondary | ICD-10-CM | POA: Diagnosis not present

## 2024-05-26 DIAGNOSIS — E785 Hyperlipidemia, unspecified: Secondary | ICD-10-CM

## 2024-05-26 LAB — BAYER DCA HB A1C WAIVED: HB A1C (BAYER DCA - WAIVED): 4.3 % — ABNORMAL LOW (ref 4.8–5.6)

## 2024-05-26 MED ORDER — AMLODIPINE BESYLATE 5 MG PO TABS: 5.0000 mg | ORAL_TABLET | Freq: Every day | ORAL | 3 refills | Status: AC

## 2024-05-26 MED ORDER — METFORMIN HCL 500 MG PO TABS: 500.0000 mg | ORAL_TABLET | Freq: Every day | ORAL | 3 refills | Status: DC

## 2024-05-26 MED ORDER — GABAPENTIN 300 MG PO CAPS: ORAL_CAPSULE | ORAL | 3 refills | Status: AC

## 2024-05-26 MED ORDER — ATORVASTATIN CALCIUM 40 MG PO TABS: 40.0000 mg | ORAL_TABLET | Freq: Every day | ORAL | 3 refills | Status: AC

## 2024-05-26 MED ORDER — REPATHA SURECLICK 140 MG/ML ~~LOC~~ SOAJ: 140.0000 mg | SUBCUTANEOUS | 3 refills | Status: AC

## 2024-05-26 MED ORDER — HYDROCHLOROTHIAZIDE 25 MG PO TABS: 25.0000 mg | ORAL_TABLET | Freq: Every day | ORAL | 3 refills | Status: AC

## 2024-05-26 MED ORDER — ESCITALOPRAM OXALATE 10 MG PO TABS: 10.0000 mg | ORAL_TABLET | Freq: Every day | ORAL | 3 refills | Status: AC

## 2024-05-26 MED ORDER — POTASSIUM CHLORIDE CRYS ER 20 MEQ PO TBCR: 20.0000 meq | EXTENDED_RELEASE_TABLET | Freq: Every day | ORAL | 3 refills | Status: AC

## 2024-05-26 MED ORDER — OZEMPIC (0.25 OR 0.5 MG/DOSE) 2 MG/1.5ML ~~LOC~~ SOPN: 0.5000 mg | PEN_INJECTOR | SUBCUTANEOUS | 3 refills | Status: AC

## 2024-05-26 NOTE — Patient Instructions (Addendum)
 Schedule Diabetic eye exam OK to STOP Metformin . Let your pharmacy know to cancel rx.  Diabetes: Types of Medical Care to Help You Manage Living with and managing diabetes, also known as diabetes mellitus, can be a challenge. Your diabetes care may involve a team of health care providers to help you manage. This team may include: An endocrinologist. This is a physician who specializes in diabetes. Nurses. An expert in healthy eating called a dietitian. A diabetes care and education specialist. An eye doctor. A foot specialist called a podiatrist. How to manage your diabetes Managing your diabetes involves a lot of steps to keep you healthy. Your team will follow guidelines to help you get the best care possible. Here are some general guidelines for managing your diabetes: Physical exams When you're first diagnosed with diabetes, and each year after that, your provider will ask about your medical and family history. You'll also have a physical exam. It may include: Measuring your height, weight, and body mass index (BMI). Checking your blood pressure. Your target blood pressure may vary based on things like your age and health conditions. A thyroid exam. A skin exam. Screening for nerve damage. This may include checking your hands, feet, and arms for: Pain. Tingling or numbness. Weakness. An exam to check your feet. They'll be checked for cuts, bruises, redness, blisters, sores, or other problems. Screening for blood vessel problems. This may include checking the pulse and temperature in your legs and feet. Blood tests Depending on your treatment plan, you may have these tests: Hemoglobin A1C (HbA1C). This test gives information about your blood sugar levels, also called glucose levels, over the past 2-3 months. It's used to adjust your treatment plan, if needed. Lipid testing. This includes total cholesterol, LDL and HDL cholesterol, and triglyceride levels. The goal for LDL is less  than 100 mg/dL (5.5 mmol/L). If you're at high risk for problems, the goal is less than 70 mg/dL (3.9 mmol/L). The goal for HDL is 40 mg/dL (2.2 mmol/L) or higher for males, and 50 mg/dL (2.8 mmol/L) or higher for females. The goal for triglycerides is less than 150 mg/dL (8.3 mmol/L). Liver function tests. Kidney function tests. Thyroid function tests.  Dental and eye exams  Visit your dentist two times a year for checkups. Get eye exams as recommended by your provider. This may include: If you have type 1 diabetes, get an eye exam within 5 years after you're diagnosed. Then get exams once a year after your first exam. Children with type 1 diabetes should get an eye exam when they're 59 years old or older and they've had diabetes for 3-5 years. After the first exam, they should get an eye exam every 2 years. If you have type 2 diabetes, get an eye exam as soon as you're diagnosed. Then get one every 1-2 years after your first exam. Shots or vaccines Get shots or vaccines as told. Guidelines include: Everyone aged 28 months and older should get a flu shot every year. People at least 53 years old who have diabetes should get the pneumonia vaccine. All adults who get diagnosed with diabetes should get the hepatitis B vaccine. For all other vaccines, follow the recommendations from the Centers for Disease Control and Prevention (CDC) based on your age. Mental and emotional health Screening for eating disorders, anxiety, and depression is suggested at the time of diagnosis and then as needed. If you show symptoms, you may need more evaluation. You may need to work with a  mental health provider. Follow these instructions at home: Treatment plan You'll monitor your blood sugar levels and may give yourself insulin. Your treatment plan will be reviewed at every medical visit. You and your provider will discuss: How you're taking your medicines, including insulin. Any side effects you have. Your  target goals for your blood sugar level. How often you check your blood sugar level. Lifestyle habits, such as: Your activity level. Any use of tobacco, alcohol, or other substances. Education Your provider will assess how well you manage your blood sugar levels and medicines. You may be referred to: A certified diabetes care and education specialist. This person can help you manage your diabetes throughout your life. A dietitian who can help with your eating plan. An exercise specialist who can discuss your activity level and exercise plan. General instructions Take medicines only as told. Where to find more information American Diabetes Association (ADA): diabetes.org Association of Diabetes Care & Education Specialists (ADCES): adces.org/diabetes-education-dsmes International Diabetes Federation (IDF): http://hill.biz/ This information is not intended to replace advice given to you by your health care provider. Make sure you discuss any questions you have with your health care provider. Document Revised: 01/26/2023 Document Reviewed: 01/26/2023 Elsevier Patient Education  2024 [employer].

## 2024-05-26 NOTE — Progress Notes (Signed)
 Subjective: CC:DM PCP: Jolinda Norene HERO, DO YEP:Opdj Bridget Gutierrez is a 53 y.o. female presenting to clinic today for:  Type 2 Diabetes with hypertension, hyperlipidemia w/ fatty liver:  Reports no hypoglycemia.  Compliant with metformin  and Ozempic .  Denies any abdominal pain, nausea, vomiting.  Compliant with all of her cholesterol medications including Repatha  and statin.  Continues to try and strive for a balanced diet and tries to grow her own vegetables and stay physically active if her body will allow  ROS: No chest pain, shortness of breath or edema reported.  No visual disturbance reported.  She will get diabetic eye exam scheduled.   Diabetes Health Maintenance Due  Topic Date Due   OPHTHALMOLOGY EXAM  02/28/2024   HEMOGLOBIN A1C  05/22/2024   FOOT EXAM  07/12/2024    ROS: Per HPI  No Known Allergies Past Medical History:  Diagnosis Date   Anxiety    B12 deficiency    Depression    Fatty liver    Hyperlipidemia    Hypertension    Tubular adenoma of colon 2019    Current Outpatient Medications:    cetirizine  (EQ ALLERGY RELIEF, CETIRIZINE ,) 10 MG tablet, Take 1 tablet (10 mg total) by mouth daily., Disp: 90 tablet, Rfl: 1   cyanocobalamin 500 MCG tablet, Take 500 mcg by mouth daily. , Disp: , Rfl:    ibuprofen  (ADVIL ) 600 MG tablet, Take 1 tablet (600 mg total) by mouth every 6 (six) hours as needed., Disp: 20 tablet, Rfl: 0   omega-3 acid ethyl esters (LOVAZA ) 1 g capsule, Take 2 capsules (2 g total) by mouth 2 (two) times daily., Disp: 360 capsule, Rfl: 3   amLODipine  (NORVASC ) 5 MG tablet, Take 1 tablet (5 mg total) by mouth daily., Disp: 90 tablet, Rfl: 3   atorvastatin  (LIPITOR) 40 MG tablet, Take 1 tablet (40 mg total) by mouth daily., Disp: 90 tablet, Rfl: 3   escitalopram  (LEXAPRO ) 10 MG tablet, Take 1 tablet (10 mg total) by mouth daily., Disp: 90 tablet, Rfl: 3   Evolocumab  (REPATHA  SURECLICK) 140 MG/ML SOAJ, Inject 140 mg as directed every 14 (fourteen)  days., Disp: 6 mL, Rfl: 3   ferrous sulfate 325 (65 FE) MG tablet, Take 325 mg by mouth daily with breakfast. (Patient not taking: Reported on 05/26/2024), Disp: , Rfl:    gabapentin  (NEURONTIN ) 300 MG capsule, Take 1 capsule each morning and 2 capsules each evening for neuropathy, Disp: 270 capsule, Rfl: 3   hydrochlorothiazide  (HYDRODIURIL ) 25 MG tablet, Take 1 tablet (25 mg total) by mouth daily., Disp: 90 tablet, Rfl: 3   metFORMIN  (GLUCOPHAGE ) 500 MG tablet, Take 1 tablet (500 mg total) by mouth daily with breakfast., Disp: 90 tablet, Rfl: 3   potassium chloride  SA (KLOR-CON  M) 20 MEQ tablet, Take 1 tablet (20 mEq total) by mouth daily., Disp: 90 tablet, Rfl: 3   Semaglutide ,0.25 or 0.5MG /DOS, (OZEMPIC , 0.25 OR 0.5 MG/DOSE,) 2 MG/1.5ML SOPN, Inject 0.5 mg into the skin every 7 (seven) days., Disp: 9 mL, Rfl: 3 Social History   Socioeconomic History   Marital status: Married    Spouse name: Not on file   Number of children: Not on file   Years of education: Not on file   Highest education level: Not on file  Occupational History   Not on file  Tobacco Use   Smoking status: Every Day    Current packs/day: 1.50    Average packs/day: 1.5 packs/day for 15.0 years (22.5 ttl pk-yrs)  Types: Cigarettes   Smokeless tobacco: Never  Vaping Use   Vaping status: Never Used  Substance and Sexual Activity   Alcohol use: No    Comment: Former 28 cans beer/ week   Drug use: No   Sexual activity: Not Currently  Other Topics Concern   Not on file  Social History Narrative   Not on file   Social Drivers of Health   Financial Resource Strain: Low Risk  (11/15/2020)   Overall Financial Resource Strain (CARDIA)    Difficulty of Paying Living Expenses: Not hard at all  Food Insecurity: No Food Insecurity (11/15/2020)   Hunger Vital Sign    Worried About Running Out of Food in the Last Year: Never true    Ran Out of Food in the Last Year: Never true  Transportation Needs: No Transportation  Needs (11/15/2020)   PRAPARE - Administrator, Civil Service (Medical): No    Lack of Transportation (Non-Medical): No  Physical Activity: Insufficiently Active (11/24/2020)   Received from [facility]   Exercise Vital Sign    On average, how many days per week do you engage in moderate to strenuous exercise (like a brisk walk)?: 2 days    On average, how many minutes do you engage in exercise at this level?: 30 min  Stress: No Stress Concern Present (11/24/2020)   Received from [facility] - Occupational Stress Questionnaire    Feeling of Stress : Not at all  Social Connections: Moderately Isolated (11/15/2020)   Social Connection and Isolation Panel    Frequency of Communication with Friends and Family: More than three times a week    Frequency of Social Gatherings with Friends and Family: Once a week    Attends Religious Services: Never    Database Administrator or Organizations: No    Attends Banker Meetings: Never    Marital Status: Married  Catering Manager Violence: Not At Risk (11/15/2020)   Humiliation, Afraid, Rape, and Kick questionnaire    Fear of Current or Ex-Partner: No    Emotionally Abused: No    Physically Abused: No    Sexually Abused: No   Family History  Problem Relation Age of Onset   COPD Mother    Hypertension Mother    Cancer Mother    Hyperlipidemia Mother    Cervical cancer Mother    Heart disease Mother    Heart attack Mother    Hypertension Father    Cancer Sister    Colon cancer Sister 58   Anxiety disorder Daughter    Testicular cancer Brother    Breast cancer Neg Hx     Objective: Office vital signs reviewed. BP 134/84   Pulse 75   Temp 97.8 F (36.6 C)   Ht 5' 4 (1.626 m)   Wt 168 lb 8 oz (76.4 kg)   LMP 09/17/2017 (Approximate)   SpO2 95%   BMI 28.92 kg/m   Physical Examination:  General: Awake, alert, well nourished, No acute distress HEENT:  Xanthelasmas noted along the eyelids.  Sclera white. Cardio: regular rate and rhythm, S1S2 heard, no murmurs appreciated Pulm: clear to auscultation bilaterally, no wheezes, rhonchi or rales; normal work of breathing on room air GI: soft, non-tender, non-distended, bowel sounds present x4, no hepatomegaly, no splenomegaly, no masses Extremities: warm, well perfused, No edema, cyanosis or clubbing; +2 pulses bilaterally     Lab Results  Component Value Date  HGBA1C 5.0 11/20/2023    Assessment/ Plan: 53 y.o. female   Diabetes mellitus treated with injections of non-insulin medication (HCC) - Plan: Bayer DCA Hb A1c Waived, CMP14+EGFR, Semaglutide ,0.25 or 0.5MG /DOS, (OZEMPIC , 0.25 OR 0.5 MG/DOSE,) 2 MG/1.5ML SOPN, DISCONTINUED: metFORMIN  (GLUCOPHAGE ) 500 MG tablet  Hypertension associated with diabetes (HCC) - Plan: CMP14+EGFR, amLODipine  (NORVASC ) 5 MG tablet, hydrochlorothiazide  (HYDRODIURIL ) 25 MG tablet, potassium chloride  SA (KLOR-CON  M) 20 MEQ tablet  Hyperlipidemia associated with type 2 diabetes mellitus (HCC) - Plan: Lipid Panel, CMP14+EGFR, Evolocumab  (REPATHA  SURECLICK) 140 MG/ML SOAJ  Fatty liver disease, nonalcoholic - Plan: atorvastatin  (LIPITOR) 40 MG tablet  Anxiety and depression - Plan: escitalopram  (LEXAPRO ) 10 MG tablet  Peripheral neuralgia - Plan: gabapentin  (NEURONTIN ) 300 MG capsule  Hepatitis B vaccination status unknown - Plan: Hepatitis B surface antibody,quantitative  Check labs.  She will schedule diabetic eye exam.  Okay to discontinue metformin  given A1c of 5.0 in May.  Continue Ozempic  alone along with antihypertensives and cholesterol medications to prevent progression of fatty liver disease  We did not discuss anxiety, depression, neuralgia but needed refills prior to her next visit.  Check hepatitis B titers given unknown vaccination status  Marcelus Dubberly CHRISTELLA Fielding, DO [facility] [phone]

## 2024-05-27 ENCOUNTER — Ambulatory Visit: Payer: Self-pay | Admitting: Family Medicine

## 2024-05-27 LAB — LIPID PANEL
Chol/HDL Ratio: 4.7 ratio — ABNORMAL HIGH (ref 0.0–4.4)
Cholesterol, Total: 183 mg/dL (ref 100–199)
HDL: 39 mg/dL — ABNORMAL LOW (ref >39)
LDL Chol Calc (NIH): 115 mg/dL — ABNORMAL HIGH (ref 0–99)
Triglycerides: 166 mg/dL — ABNORMAL HIGH (ref 0–149)
VLDL Cholesterol Cal: 29 mg/dL (ref 5–40)

## 2024-05-27 LAB — CMP14+EGFR
ALT: 38 IU/L — ABNORMAL HIGH (ref 0–32)
AST: 28 IU/L (ref 0–40)
Albumin: 4.4 g/dL (ref 3.8–4.9)
Alkaline Phosphatase: 91 IU/L (ref 49–135)
BUN/Creatinine Ratio: 9 (ref 9–23)
BUN: 7 mg/dL (ref 6–24)
Bilirubin Total: 0.3 mg/dL (ref 0.0–1.2)
CO2: 22 mmol/L (ref 20–29)
Calcium: 9.9 mg/dL (ref 8.7–10.2)
Chloride: 103 mmol/L (ref 96–106)
Creatinine, Ser: 0.75 mg/dL (ref 0.57–1.00)
Globulin, Total: 2.5 g/dL (ref 1.5–4.5)
Glucose: 96 mg/dL (ref 70–99)
Potassium: 4.3 mmol/L (ref 3.5–5.2)
Sodium: 140 mmol/L (ref 134–144)
Total Protein: 6.9 g/dL (ref 6.0–8.5)
eGFR: 95 mL/min/1.73 (ref >59)

## 2024-05-27 LAB — HEPATITIS B SURFACE ANTIBODY, QUANTITATIVE: Hepatitis B Surf Ab Quant: 94.2 m[IU]/mL

## 2024-07-05 ENCOUNTER — Other Ambulatory Visit: Payer: Self-pay | Admitting: Family Medicine

## 2024-07-05 DIAGNOSIS — J301 Allergic rhinitis due to pollen: Secondary | ICD-10-CM

## 2024-07-20 ENCOUNTER — Other Ambulatory Visit: Payer: Self-pay | Admitting: Family Medicine

## 2024-07-20 DIAGNOSIS — E1169 Type 2 diabetes mellitus with other specified complication: Secondary | ICD-10-CM

## 2024-07-20 DIAGNOSIS — K760 Fatty (change of) liver, not elsewhere classified: Secondary | ICD-10-CM
